# Patient Record
Sex: Male | Born: 1975 | Race: White | Hispanic: No | Smoking: Former smoker
Health system: Southern US, Community
[De-identification: ages and names within clinical notes are randomized; demographics above are authoritative.]

## PROBLEM LIST (undated history)

## (undated) DIAGNOSIS — K219 Gastro-esophageal reflux disease without esophagitis: Secondary | ICD-10-CM

## (undated) DIAGNOSIS — I871 Compression of vein: Secondary | ICD-10-CM

## (undated) DIAGNOSIS — K7460 Unspecified cirrhosis of liver: Secondary | ICD-10-CM

## (undated) DIAGNOSIS — G4733 Obstructive sleep apnea (adult) (pediatric): Secondary | ICD-10-CM

## (undated) HISTORY — DX: Obstructive sleep apnea (adult) (pediatric): G47.33

## (undated) HISTORY — PX: IMPACTED THIRD MOLAR REMOVAL: SHX1790

---

## 2010-02-26 ENCOUNTER — Ambulatory Visit: Payer: Self-pay | Admitting: Family Medicine

## 2010-02-26 DIAGNOSIS — K219 Gastro-esophageal reflux disease without esophagitis: Secondary | ICD-10-CM | POA: Insufficient documentation

## 2010-03-27 ENCOUNTER — Encounter: Payer: Self-pay | Admitting: Family Medicine

## 2010-03-27 ENCOUNTER — Ambulatory Visit: Payer: Self-pay | Admitting: Family Medicine

## 2010-03-27 LAB — CONVERTED CEMR LAB: H Pylori IgG: NEGATIVE

## 2010-03-31 DIAGNOSIS — E781 Pure hyperglyceridemia: Secondary | ICD-10-CM | POA: Insufficient documentation

## 2010-03-31 LAB — CONVERTED CEMR LAB
Cholesterol: 147 mg/dL (ref 0–200)
HDL: 30 mg/dL — ABNORMAL LOW (ref >39)
Platelets: 259 10*3/uL (ref 150–400)
RBC: 5.35 M/uL (ref 4.22–5.81)
Total CHOL/HDL Ratio: 4.9
Triglycerides: 318 mg/dL — ABNORMAL HIGH (ref <150)
VLDL: 64 mg/dL — ABNORMAL HIGH (ref 0–40)
WBC: 8.5 10*3/uL (ref 4.0–10.5)

## 2010-09-26 DIAGNOSIS — M545 Low back pain, unspecified: Secondary | ICD-10-CM | POA: Insufficient documentation

## 2010-09-30 ENCOUNTER — Encounter: Payer: Self-pay | Admitting: Family Medicine

## 2010-09-30 ENCOUNTER — Encounter
Admission: RE | Admit: 2010-09-30 | Discharge: 2010-09-30 | Payer: Self-pay | Source: Home / Self Care | Attending: Family Medicine | Admitting: Family Medicine

## 2010-10-01 ENCOUNTER — Ambulatory Visit: Payer: Self-pay | Admitting: Family Medicine

## 2010-11-20 NOTE — Letter (Signed)
Summary: Generic Letter  [facility]  [address]   [place], [place] [address]   Phone: [phone]  Fax: [phone]    09/30/2010  Loura Halt [address] [place], [place]  [address]  Dear Mr. ANDER,  Your back X rays were completely normal.  That is one more piece of evidence that your back pain is more of a periodic nusance rather than a serious medical problem.  I hope the medications and exercises we discussed help.  Call my office if you have questions.   Sincerely,      Doralee Albino MD

## 2010-11-20 NOTE — Assessment & Plan Note (Signed)
Summary: NP,DF   Vital Signs:  Patient profile:   35 year old male Height:      68.5 inches Weight:      212.7 pounds BMI:     31.99 Temp:     97.9 degrees F oral Pulse rate:   71 / minute BP sitting:   138 / 90  (left arm) Cuff size:   regular  Vitals Entered By: Gladstone Pih (Feb 26, 2010 8:39 AM) CC: NP  get established Is Patient Diabetic? No Pain Assessment Patient in pain? no        CC:  NP  get established.  History of Present Illness: Here to get established.   No active complaints but has some questions. Frequent heartburn late in the evening.   No prescription meds. Does have low back, ankle and knee pains after heavy exercise.  Takes ibuprofen - has not noticed a correlation with heartburn pain.  Habits & Providers  Alcohol-Tobacco-Diet     Alcohol drinks/day: <1     Alcohol Counseling: not indicated; patient does not drink     Tobacco Status: never     Diet Comments: Could be improved  Exercise-Depression-Behavior     Does Patient Exercise: yes     Exercise Counseling: not indicated; exercise is adequate     Exercise (avg: min/session): >60     Times/week: 4     Have you felt down or hopeless? no     Have you felt little pleasure in things? no     STD Risk: never     STD Risk Counseling: not indicated-no STD risk noted     Drug Use: never     Seat Belt Use: always     Sun Exposure: infrequent  Current Medications (verified): 1)  Vitamin C 500 Mg Tabs (Ascorbic Acid) .... 2 Per Day Otc 2)  B Complex-B12  Tabs (B Complex Vitamins) .... One Daily Otc 3)  Glucosamine-Chondroitin  Caps (Glucosamine-Chondroit-Vit C-Mn) .... One Two Times A Day Otc 4)  Epinephrine 0.3 Mg/0.62ml Devi (Epinephrine) .... Use Immediately If Stung By Yellowjacket 5)  Nexium 40 Mg Cpdr (Esomeprazole Magnesium) .... One By Mouth Daily  Allergies (verified): 1)  ! * Yellow Jackets 2)  ! * Watermelon  Past History:  Family History: Last updated: 02/26/2010 + HBP and  high chol and CVA and melanoma and DM and  EtOHism - depression  Social History: Last updated: 02/26/2010 [employer] work.  Risk Factors: Alcohol Use: <1 (02/26/2010) Diet: Could be improved (02/26/2010) Exercise: yes (02/26/2010)  Risk Factors: Smoking Status: never (02/26/2010)  Family History: + HBP and high chol and CVA and melanoma and DM and  EtOHism - depression  Social History: [employer] work.Smoking Status:  never Does Patient Exercise:  yes STD Risk:  never Drug Use:  never Seat Belt Use:  always Sun Exposure-Excessive:  infrequent  Physical Exam  General:  Well-developed,well-nourished,in no acute distress; alert,appropriate and cooperative throughout examination Lungs:  Normal respiratory effort, chest expands symmetrically. Lungs are clear to auscultation, no crackles or wheezes. Heart:  Normal rate and regular rhythm. S1 and S2 normal without gallop, murmur, click, rub or other extra sounds. Abdomen:  Bowel sounds positive,abdomen soft and non-tender without masses, organomegaly or hernias noted.   Impression & Recommendations:  Problem # 1:  GERD (ICD-530.81) most likely by history His updated medication list for this problem includes:    Nexium 40 Mg Cpdr (Esomeprazole magnesium) ..... One by mouth daily  Future Orders: CBC-[facility] ([address]) .Marland KitchenMarland Kitchen  02/18/2011 H pylori-[facility] ([id number]) ... 02/18/2011  Problem # 2:  SCREENING FOR LIPOID DISORDERS (ICD-V77.91)  Future Orders: Lipid-[facility] ([id number]) ... 02/18/2011  Problem # 3:  Preventive Health Care (ICD-V70.0) Generally healthy.  Tetanus today.  Major way to improve would be diet (low sodium because prehypertension)  Complete Medication List: 1)  Vitamin C 500 Mg Tabs (Ascorbic acid) .... 2 per day otc 2)  B Complex-b12 Tabs (B complex vitamins) .... One daily otc 3)  Glucosamine-chondroitin Caps (Glucosamine-chondroit-vit c-mn) .... One two times a day otc 4)  Epinephrine 0.3 Mg/0.50ml Devi (Epinephrine)  .... Use immediately if stung by yellowjacket 5)  Nexium 40 Mg Cpdr (Esomeprazole magnesium) .... One by mouth daily  Other Orders: Tdap => 36yrs IM ([address]) Admin 1st Vaccine ([address]) Prescriptions: NEXIUM 40 MG CPDR (ESOMEPRAZOLE MAGNESIUM) one by mouth daily  #90 x 3   Entered and Authorized by:   Doralee Albino MD   Signed by:   Doralee Albino MD on 02/26/2010   Method used:   Electronically to        [facility]* (retail)       [address]       [address] Shipping/mailing       [place], [place]  [address]       Ph: [phone]       Fax: [phone]   RxID:   [id number] EPINEPHRINE 0.3 MG/0.3ML DEVI (EPINEPHRINE) use immediately if stung by yellowjacket  #1 x 0   Entered and Authorized by:   Doralee Albino MD   Signed by:   Doralee Albino MD on 02/26/2010   Method used:   Electronically to        [facility]* (retail)       [address]       [address] [place] [address]       [place], [place]  [address]       Ph: [phone]       Fax: [phone]   RxID:   [id number]      Prevention & Chronic Care Immunizations   Influenza vaccine: Not documented    Tetanus booster: 02/26/2010: Tdap    Pneumococcal vaccine: Not documented  Other Screening   Smoking status: never  (02/26/2010)   Nursing Instructions: Give tetanus booster today     Immunizations Administered:  Tetanus Vaccine:    Vaccine Type: Tdap    Site: left deltoid    Mfr: GlaxoSmithKline    Dose: 0.5 ml    Route: IM    Given by: Gladstone Pih    Exp. Date: 01/11/2012    Lot #: [id number]    VIS given: 09/06/07 version given Feb 26, 2010.    Physician counseled: yes

## 2010-11-20 NOTE — Assessment & Plan Note (Signed)
Summary: back pain/eo   Vital Signs:  Patient profile:   35 year old male Weight:      215 pounds BMI:     32.33 Temp:     99.1 degrees F oral Pulse rate:   82 / minute Pulse rhythm:   regular BP sitting:   147 / 93  (left arm) Cuff size:   regular  Vitals Entered By: Loralee Pacas CMA (September 26, 2010 5:23 PM) CC: back pain Pain Assessment Patient in pain? yes     Location: lower back Intensity: 7   CC:  back pain.  History of Present Illness: Low back pain.  Recurrent episodes.  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  Initially had right leg pain.  Now only low back pain.  No bowel or bladder complaints.  No prior Xrays.  Current Medications (verified): 1)  Vitamin C 500 Mg Tabs (Ascorbic Acid) .... 2 Per Day Otc 2)  B Complex-B12  Tabs (B Complex Vitamins) .... One Daily Otc 3)  Glucosamine-Chondroitin  Caps (Glucosamine-Chondroit-Vit C-Mn) .... One Two Times A Day Otc 4)  Epinephrine 0.3 Mg/0.92ml Devi (Epinephrine) .... Use Immediately If Stung By Yellowjacket 5)  Nexium 40 Mg Cpdr (Esomeprazole Magnesium) .... One By Mouth Daily 6)  Ibuprofen 800 Mg Tabs (Ibuprofen) .... One By Mouth Three Times A Day As Needed Back/shoulder Pain 7)  Cyclobenzaprine Hcl 10 Mg Tabs (Cyclobenzaprine Hcl) .... One By Mouth At Bedtime As Needed Back Spasm  Allergies (verified): 1)  ! * Yellow Jackets 2)  ! * Watermelon  Past History:  Past medical, surgical, family and social histories (including risk factors) reviewed, and no changes noted (except as noted below).  Family History: Reviewed history from 02/26/2010 and no changes required. + HBP and high chol and CVA and melanoma and DM and  EtOHism Father diagnosed with cirrhosis and liver cancer 2011 - depression  Social History: Reviewed history from 02/26/2010 and no changes required. [employer] work.  Physical Exam  General:  Well-developed,well-nourished,in no acute distress; alert,appropriate and cooperative  throughout examination  Again, borderline HBP noted. Extremities:  Lumbar spasm with decreased ROM.  Normal strength and reflexes in legs   Impression & Recommendations:  Problem # 1:  LOW BACK PAIN (ICD-724.2) Educated.  Since no previous Xrays, will get lumbar films.  I'll call with results. His updated medication list for this problem includes:    Ibuprofen 800 Mg Tabs (Ibuprofen) ..... One by mouth three times a day as needed back/shoulder pain    Cyclobenzaprine Hcl 10 Mg Tabs (Cyclobenzaprine hcl) ..... One by mouth at bedtime as needed back spasm  Orders: Radiology other (Radiology Other)  Complete Medication List: 1)  Vitamin C 500 Mg Tabs (Ascorbic acid) .... 2 per day otc 2)  B Complex-b12 Tabs (B complex vitamins) .... One daily otc 3)  Glucosamine-chondroitin Caps (Glucosamine-chondroit-vit c-mn) .... One two times a day otc 4)  Epinephrine 0.3 Mg/0.3ml Devi (Epinephrine) .... Use immediately if stung by yellowjacket 5)  Nexium 40 Mg Cpdr (Esomeprazole magnesium) .... One by mouth daily 6)  Ibuprofen 800 Mg Tabs (Ibuprofen) .... One by mouth three times a day as needed back/shoulder pain 7)  Cyclobenzaprine Hcl 10 Mg Tabs (Cyclobenzaprine hcl) .... One by mouth at bedtime as needed back spasm Prescriptions: CYCLOBENZAPRINE HCL 10 MG TABS (CYCLOBENZAPRINE HCL) one by mouth at bedtime as needed back spasm  #30 x 3   Entered and Authorized by:   Doralee Albino MD   Signed  by:   Doralee Albino MD on 09/26/2010   Method used:   Electronically to        [facility]* (retail)       [address]       [address]mail[address]       [place], [place]  [address]       Ph: [phone]       Fax: [phone]   RxID:   [id number] IBUPROFEN 800 MG TABS (IBUPROFEN) one by mouth three times a day as needed back/shoulder pain  #100 x 3   Entered and Authorized by:   Doralee Albino MD   Signed by:   Doralee Albino MD on 09/26/2010   Method used:    Electronically to        [facility]* (retail)       [address]       [address] [place] [address]       [place], [place]  [address]       Ph: [phone]       Fax: [phone]   RxID:   [id number]    Orders Added: 1)  Radiology other [Radiology Other] 2)  [facility] [[address]]

## 2011-04-15 ENCOUNTER — Ambulatory Visit (INDEPENDENT_AMBULATORY_CARE_PROVIDER_SITE_OTHER): Payer: 59 | Admitting: Family Medicine

## 2011-04-15 ENCOUNTER — Encounter: Payer: Self-pay | Admitting: Family Medicine

## 2011-04-15 VITALS — BP 129/83 | HR 76 | Ht 68.0 in | Wt 213.2 lb

## 2011-04-15 DIAGNOSIS — R0683 Snoring: Secondary | ICD-10-CM

## 2011-04-15 DIAGNOSIS — R0609 Other forms of dyspnea: Secondary | ICD-10-CM

## 2011-04-15 DIAGNOSIS — L02419 Cutaneous abscess of limb, unspecified: Secondary | ICD-10-CM

## 2011-04-15 DIAGNOSIS — L03119 Cellulitis of unspecified part of limb: Secondary | ICD-10-CM | POA: Insufficient documentation

## 2011-04-15 DIAGNOSIS — R0989 Other specified symptoms and signs involving the circulatory and respiratory systems: Secondary | ICD-10-CM

## 2011-04-15 DIAGNOSIS — R5381 Other malaise: Secondary | ICD-10-CM

## 2011-04-15 DIAGNOSIS — R5383 Other fatigue: Secondary | ICD-10-CM | POA: Insufficient documentation

## 2011-04-15 DIAGNOSIS — Z0000 Encounter for general adult medical examination without abnormal findings: Secondary | ICD-10-CM

## 2011-04-15 DIAGNOSIS — G4700 Insomnia, unspecified: Secondary | ICD-10-CM

## 2011-04-15 MED ORDER — DOXYCYCLINE HYCLATE 100 MG PO TABS: 100.0000 mg | ORAL_TABLET | Freq: Two times a day (BID) | ORAL | Status: AC

## 2011-04-16 ENCOUNTER — Encounter: Payer: Self-pay | Admitting: Family Medicine

## 2011-04-16 DIAGNOSIS — G4733 Obstructive sleep apnea (adult) (pediatric): Secondary | ICD-10-CM | POA: Insufficient documentation

## 2011-04-16 DIAGNOSIS — Z0000 Encounter for general adult medical examination without abnormal findings: Secondary | ICD-10-CM | POA: Insufficient documentation

## 2011-04-16 DIAGNOSIS — G4700 Insomnia, unspecified: Secondary | ICD-10-CM | POA: Insufficient documentation

## 2011-04-16 LAB — COMPLETE METABOLIC PANEL WITH GFR
Albumin: 4.9 g/dL (ref 3.5–5.2)
Alkaline Phosphatase: 83 U/L (ref 39–117)
BUN: 10 mg/dL (ref 6–23)
Creat: 0.82 mg/dL (ref 0.50–1.35)
GFR, Est Non African American: >60 mL/min (ref >60)
Glucose, Bld: 88 mg/dL (ref 70–99)
Total Bilirubin: 0.5 mg/dL (ref 0.3–1.2)

## 2011-04-16 NOTE — Progress Notes (Signed)
  Subjective:    Patient ID: Jordan Collins, male    DOB: [birth year], 35 y.o.   MRN: [id number]  HPI  In for annual exam.  Three complaints: #1 sleep difficulty.  Discussed sleep hygiene.  Given handout. #2 snores loudly.  No apparent resp cessation.  Only snores on back. #3 red rash on legs with non healing ulcer for 1 month. Back pain improved and now doing exercises.    Not doing enough aerobic exercises - thinking about biking to work.     Review of Systems  Denies bleeding, CP, SOB, leg swelling.  No change in bowel, bladder or wt.  Endorses increased fatigue     Objective:   Physical Exam HEENT, nl Neck supple without mass Lungs clear Cardiac RRR Abd benign .Ext without edema  Small ulcers left lower leg with surrounding erythema.       Assessment & Plan:

## 2011-04-16 NOTE — Assessment & Plan Note (Signed)
Discussed and given handout on sleep hygiene

## 2011-04-16 NOTE — Assessment & Plan Note (Signed)
Check CMP - especially concerned re DM given non healing leg sores.

## 2011-04-16 NOTE — Assessment & Plan Note (Signed)
Healthy male.  Could do with modest wt loss and increased aerobic activity.

## 2011-04-16 NOTE — Assessment & Plan Note (Signed)
No symptoms of sleep apnea 

## 2011-04-16 NOTE — Assessment & Plan Note (Signed)
Doxy since present x 1 month.

## 2011-06-10 ENCOUNTER — Other Ambulatory Visit: Payer: Self-pay | Admitting: Family Medicine

## 2011-06-10 NOTE — Telephone Encounter (Signed)
Refill request

## 2011-08-05 ENCOUNTER — Ambulatory Visit (INDEPENDENT_AMBULATORY_CARE_PROVIDER_SITE_OTHER): Payer: 59 | Admitting: *Deleted

## 2011-08-05 ENCOUNTER — Ambulatory Visit: Payer: 59

## 2011-08-05 DIAGNOSIS — Z23 Encounter for immunization: Secondary | ICD-10-CM

## 2011-09-17 ENCOUNTER — Encounter: Payer: Self-pay | Admitting: *Deleted

## 2011-09-17 ENCOUNTER — Ambulatory Visit (INDEPENDENT_AMBULATORY_CARE_PROVIDER_SITE_OTHER): Payer: 59 | Admitting: Pulmonary Disease

## 2011-09-17 VITALS — BP 146/82 | HR 81 | Temp 98.5°F | Ht 67.0 in | Wt 215.0 lb

## 2011-09-17 DIAGNOSIS — Z720 Tobacco use: Secondary | ICD-10-CM

## 2011-09-17 DIAGNOSIS — Z87891 Personal history of nicotine dependence: Secondary | ICD-10-CM | POA: Insufficient documentation

## 2011-09-17 DIAGNOSIS — R0683 Snoring: Secondary | ICD-10-CM

## 2011-09-17 DIAGNOSIS — R1310 Dysphagia, unspecified: Secondary | ICD-10-CM | POA: Insufficient documentation

## 2011-09-17 DIAGNOSIS — R0989 Other specified symptoms and signs involving the circulatory and respiratory systems: Secondary | ICD-10-CM

## 2011-09-17 DIAGNOSIS — F17200 Nicotine dependence, unspecified, uncomplicated: Secondary | ICD-10-CM

## 2011-09-17 DIAGNOSIS — R0609 Other forms of dyspnea: Secondary | ICD-10-CM

## 2011-09-17 NOTE — Patient Instructions (Signed)
Will schedule home sleep test Will call to schedule follow up after home sleep test reviewed

## 2011-09-17 NOTE — Assessment & Plan Note (Signed)
He has snoring, witnessed apnea, and daytime sleepiness.  I am concerned that he could have sleep apnea.  I have explained how sleep apnea can affect the patient's health.  Driving precautions and importance of weight loss were discussed.  Treatment options for sleep apnea were reviewed.  To further assess will arrange for home sleep test. Explained that he may need additional in-lab tests, depending on results.

## 2011-09-17 NOTE — Assessment & Plan Note (Addendum)
He reports intermittent episodes of difficulty swallowing with solid foods (particularly meats) associated with need to regurgitate.    I have advised him to discuss with Dr. Leveda Anna, and then decide if he needs additional GI evaluation.  Of note is that he does have a history of reflux.

## 2011-09-17 NOTE — Assessment & Plan Note (Signed)
In addition to the myriad other adverse health consequences associated with smoking, I reviewed how cigarette smoking can affect his sleep.  Encouraged him to continue his smoking cessation efforts.

## 2011-09-17 NOTE — Progress Notes (Signed)
Chief Complaint  Patient presents with  . Advice Only    Pt wakes up catching his breath, snores at night sometimes very severe, sometimes  has apnea's while sleeping    History of Present Illness: Jordan Collins is a 35 y.o. male for evaluation of sleep problems.  He has noticed trouble with his sleep for some time.  His wife has told him he snores, and stops breathing while asleep.  He has noticed waking up with a choking sensation.  He is a restless sleeper.  He goes to bed at 11 pm.  He can take a while to fall asleep. He wakes up a couple times per night.  He gets out of bed at 730 am.  He feels tired throughout the day, especially in the late afternoon.  He denies headaches.  He is not using anything to help him sleep or stay awake.  The patient denies sleep walking, sleep talking, bruxism, or nightmares.  There is no history of restless legs.  He does get occasional cramps in his legs if he exerts himself too much during the day.  The patient denies sleep hallucinations, sleep paralysis, or cataplexy.  His Epworth score is 8 out of 24.  Past Medical History  Diagnosis Date  . Asthma     as a child    No past surgical history on file.  Current Outpatient Prescriptions on File Prior to Visit  Medication Sig Dispense Refill  . Ascorbic Acid (VITAMIN C) 500 MG tablet Take 1,000 mg by mouth daily as needed. OTC      . B Complex Vitamins (B COMPLEX-B12) TABS Take 1 tablet by mouth daily as needed.       Marland Kitchen EPINEPHrine (EPI-PEN) 0.3 MG/0.3ML DEVI Inject 0.3 mg into the muscle. immediately if stung by yellowjacket       . ibuprofen (ADVIL,MOTRIN) 800 MG tablet Take 800 mg by mouth 3 (three) times daily as needed. for back/shoulder pain       . NEXIUM 40 MG capsule TAKE 1 CAPSULE BY MOUTH ONCE DAILY  90 capsule  3    No Known Allergies  family history includes Heart disease in his paternal grandfather and Liver cancer in his father.   reports that he has been smoking Cigarettes.   He has a 5 pack-year smoking history. He has never used smokeless tobacco. He reports that he does not drink alcohol or use illicit drugs.  Review of Systems  HENT: Positive for trouble swallowing>>He reports trouble with solid foods (especially meats).   Respiratory: Positive for cough.   Musculoskeletal: Positive for arthralgias.    Blood pressure 146/82, pulse 81, temperature 98.5 F (36.9 C), temperature source Oral, height 5\' 7"  (1.702 m), weight 215 lb (97.523 kg), SpO2 97.00%. Body mass index is 33.67 kg/(m^2).  Physical Exam:  General - Obese HEENT - PERRLA, EOMI, no sinus tenderness, narrow nasal angles, no oral exudate, MP 3, no LAN, no thyromegaly Cardiac - s1s2 regular, no murmur Chest - no wheeze/rales/dullness Abdomen - soft, nontender Extremities - no e/c/c Neurologic - normal strength, CN intact Skin - no rashes Psychiatric - normal mood, behavior  Assessment/Plan:  Primary snoring He has snoring, witnessed apnea, and daytime sleepiness.  I am concerned that he could have sleep apnea.  I have explained how sleep apnea can affect the patient's health.  Driving precautions and importance of weight loss were discussed.  Treatment options for sleep apnea were reviewed.  To further assess will arrange for  home sleep test. Explained that he may need additional in-lab tests, depending on results.   Dysphagia He reports intermittent episodes of difficulty swallowing with solid foods (particularly meats) associated with need to regurgitate.    I have advised him to discuss with Dr. Leveda Anna, and then decide if he needs additional GI evaluation.  Of note is that he does have a history of reflux.  Tobacco abuse In addition to the myriad other adverse health consequences associated with smoking, I reviewed how cigarette smoking can affect his sleep.  Encouraged him to continue his smoking cessation efforts.     Outpatient Encounter Prescriptions as of 09/17/2011    Medication Sig Dispense Refill  . Ascorbic Acid (VITAMIN C) 500 MG tablet Take 1,000 mg by mouth daily as needed. OTC      . B Complex Vitamins (B COMPLEX-B12) TABS Take 1 tablet by mouth daily as needed.       Marland Kitchen EPINEPHrine (EPI-PEN) 0.3 MG/0.3ML DEVI Inject 0.3 mg into the muscle. immediately if stung by yellowjacket       . ibuprofen (ADVIL,MOTRIN) 800 MG tablet Take 800 mg by mouth 3 (three) times daily as needed. for back/shoulder pain       . NEXIUM 40 MG capsule TAKE 1 CAPSULE BY MOUTH ONCE DAILY  90 capsule  3  . Nutritional Supplements (MELATONIN PO) As needed       . DISCONTD: Glucosamine-Chondroit-Vit C-Mn (GLUCOSAMINE-CHONDROITIN) CAPS Take 1 capsule by mouth 2 (two) times daily.          Jordan Collins Pager:  [phone] 09/17/2011, 11:51 AM

## 2011-09-17 NOTE — Progress Notes (Deleted)
  Subjective:    Patient ID: Jordan Collins, male    DOB: 06/27/1976, 35 y.o.   MRN: [id number]  HPI    Review of Systems  HENT: Positive for trouble swallowing.   Respiratory: Positive for cough.   Musculoskeletal: Positive for arthralgias.  Heart Burn/Indigestion      Objective:   Physical Exam        Assessment & Plan:

## 2011-09-21 DIAGNOSIS — G4733 Obstructive sleep apnea (adult) (pediatric): Secondary | ICD-10-CM

## 2011-09-21 HISTORY — DX: Obstructive sleep apnea (adult) (pediatric): G47.33

## 2011-09-23 ENCOUNTER — Encounter: Payer: Self-pay | Admitting: Pulmonary Disease

## 2011-09-23 ENCOUNTER — Telehealth: Payer: Self-pay | Admitting: Pulmonary Disease

## 2011-09-23 ENCOUNTER — Ambulatory Visit (INDEPENDENT_AMBULATORY_CARE_PROVIDER_SITE_OTHER): Payer: 59 | Admitting: Pulmonary Disease

## 2011-09-23 DIAGNOSIS — R0683 Snoring: Secondary | ICD-10-CM

## 2011-09-23 DIAGNOSIS — G4733 Obstructive sleep apnea (adult) (pediatric): Secondary | ICD-10-CM

## 2011-09-23 NOTE — Telephone Encounter (Signed)
Home sleep study 09/21/11>>AHI 21.7, SpO2 low 84%.  Moderate to severe sleep apnea with positional effect.   I have reviewed his sleep test results with the patient.  Explained how sleep apnea can affect the patient's health.  Driving precautions and importance of weight loss were discussed.  Treatment options for sleep apnea were reviewed.  Will proceed with auto CPAP set up.  Will have my nurse schedule ROV in 6 to 8 weeks after set up.

## 2011-09-23 NOTE — Telephone Encounter (Signed)
I spoke with pt spouse Marliss Czar and she states she will call to make pt's apt once he is set up on cpap

## 2011-11-03 ENCOUNTER — Telehealth: Payer: Self-pay | Admitting: Pulmonary Disease

## 2011-11-03 ENCOUNTER — Encounter: Payer: Self-pay | Admitting: Pulmonary Disease

## 2011-11-03 NOTE — Telephone Encounter (Signed)
I spoke with Marliss Czar (pt spouse) and is aware of the results. She voiced her understanding will give pt results.

## 2011-11-03 NOTE — Telephone Encounter (Signed)
Auto CPAP 09/29/11 to 10/28/11>>Used on 26 of 30 nights with average 6 hrs.  Average AHI 2.5 with median pressure 8 cm H2O and 95th percentile pressure 12 cm H2O.  Will have my nurse inform pt that CPAP report looks good.  No change to current set up.

## 2011-11-05 ENCOUNTER — Other Ambulatory Visit: Payer: Self-pay | Admitting: Family Medicine

## 2011-11-05 NOTE — Telephone Encounter (Signed)
Refill request

## 2011-11-25 ENCOUNTER — Encounter: Payer: Self-pay | Admitting: Pulmonary Disease

## 2011-11-25 ENCOUNTER — Ambulatory Visit (INDEPENDENT_AMBULATORY_CARE_PROVIDER_SITE_OTHER): Payer: 59 | Admitting: Pulmonary Disease

## 2011-11-25 VITALS — BP 138/82 | HR 86 | Temp 98.6°F | Ht 67.0 in | Wt 210.8 lb

## 2011-11-25 DIAGNOSIS — G4733 Obstructive sleep apnea (adult) (pediatric): Secondary | ICD-10-CM

## 2011-11-25 NOTE — Progress Notes (Signed)
Chief Complaint  Patient presents with  . Follow-up    Pt states he wears his cpap everynight x 4-8 hrs a night. Pt denies any problems w/ mask or machine. Pt states he sleep better w/ the machine and feels more rested during the day    History of Present Illness: GIOVANY COSBY is a 36 y.o. male OSA.  Home sleep study 09/21/11>>AHI 21.7, SpO2 low 84%. Moderate to severe sleep apnea with positional effect.  Auto CPAP 09/29/11 to 10/28/11>>Used on 26 of 30 nights with average 6 hrs. Average AHI 2.5 with median pressure 8 cm H2O and 95th percentile pressure 12 cm H2O.  He has a full face mask.  He is sleeping better, and feels more energy during the day.  He is not having any trouble with his mask.  He has stopped smoking.  He is enrolled in a weight loss program.  Past Medical History  Diagnosis Date  . Asthma     as a child  . OSA (obstructive sleep apnea) 09/21/2011    No past surgical history on file.  No Known Allergies  Physical Exam:  Blood pressure 138/82, pulse 86, temperature 98.6 F (37 C), temperature source Oral, height 5\' 7"  (1.702 m), weight 210 lb 12.8 oz (95.618 kg), SpO2 99.00%. Body mass index is 33.02 kg/(m^2). Wt Readings from Last 2 Encounters:  11/25/11 210 lb 12.8 oz (95.618 kg)  09/17/11 215 lb (97.523 kg)   General - Obese  HEENT - PERRLA, EOMI, no sinus tenderness, narrow nasal angles, no oral exudate, MP 3, no LAN, no thyromegaly  Cardiac - s1s2 regular, no murmur  Chest - no wheeze/rales/dullness  Abdomen - soft, nontender  Extremities - no e/c/c  Neurologic - normal strength, CN intact  Skin - no rashes  Psychiatric - normal mood, behavior  Assessment/Plan:  Outpatient Encounter Prescriptions as of 11/25/2011  Medication Sig Dispense Refill  . Ascorbic Acid (VITAMIN C) 500 MG tablet Take 1,000 mg by mouth daily as needed. OTC      . B Complex Vitamins (B COMPLEX-B12) TABS Take 1 tablet by mouth daily as needed.       Marland Kitchen EPINEPHrine (EPI-PEN)  0.3 MG/0.3ML DEVI Inject 0.3 mg into the muscle. immediately if stung by yellowjacket       . ibuprofen (ADVIL,MOTRIN) 800 MG tablet TAKE 1 TABLET BY MOUTH 3 TIMES DAILY AS NEEDED FOR BACK/SHOULDER PAIN  100 tablet  2  . NEXIUM 40 MG capsule TAKE 1 CAPSULE BY MOUTH ONCE DAILY  90 capsule  3  . Nutritional Supplements (MELATONIN PO) As needed         Marisha Renier Pager:  [phone] 11/25/2011, 5:17 PM

## 2011-11-25 NOTE — Patient Instructions (Signed)
Follow up in 6 months 

## 2011-11-25 NOTE — Assessment & Plan Note (Signed)
He is doing well.  He is compliant and reports benefit.  Will continue auto CPAP.

## 2012-01-12 ENCOUNTER — Emergency Department (HOSPITAL_COMMUNITY)
Admission: EM | Admit: 2012-01-12 | Discharge: 2012-01-12 | Disposition: A | Discharge status: Home / Self Care | Payer: 59 | Attending: Emergency Medicine | Admitting: Emergency Medicine

## 2012-01-12 ENCOUNTER — Other Ambulatory Visit: Payer: Self-pay

## 2012-01-12 ENCOUNTER — Emergency Department (HOSPITAL_COMMUNITY): Payer: 59

## 2012-01-12 ENCOUNTER — Encounter (HOSPITAL_COMMUNITY): Payer: Self-pay | Admitting: *Deleted

## 2012-01-12 DIAGNOSIS — R079 Chest pain, unspecified: Secondary | ICD-10-CM | POA: Insufficient documentation

## 2012-01-12 DIAGNOSIS — R0602 Shortness of breath: Secondary | ICD-10-CM | POA: Insufficient documentation

## 2012-01-12 HISTORY — DX: Gastro-esophageal reflux disease without esophagitis: K21.9

## 2012-01-12 LAB — CBC
HCT: 42.9 % (ref 39.0–52.0)
Hemoglobin: 15.2 g/dL (ref 13.0–17.0)
MCH: 29.4 pg (ref 26.0–34.0)
MCHC: 35.4 g/dL (ref 30.0–36.0)
MCV: 83 fL (ref 78.0–100.0)
Platelets: 265 10*3/uL (ref 150–400)
RBC: 5.17 MIL/uL (ref 4.22–5.81)
RDW: 12.5 % (ref 11.5–15.5)
WBC: 9.1 10*3/uL (ref 4.0–10.5)

## 2012-01-12 LAB — DIFFERENTIAL
Basophils Absolute: 0 10*3/uL (ref 0.0–0.1)
Basophils Relative: 0 % (ref 0–1)
Eosinophils Absolute: 0.1 10*3/uL (ref 0.0–0.7)
Eosinophils Relative: 1 % (ref 0–5)
Lymphocytes Relative: 25 % (ref 12–46)
Lymphs Abs: 2.2 10*3/uL (ref 0.7–4.0)
Monocytes Absolute: 0.7 10*3/uL (ref 0.1–1.0)
Monocytes Relative: 8 % (ref 3–12)
Neutro Abs: 6 10*3/uL (ref 1.7–7.7)
Neutrophils Relative %: 67 % (ref 43–77)

## 2012-01-12 LAB — POCT I-STAT TROPONIN I: Troponin i, poc: 0 ng/mL (ref 0.00–0.08)

## 2012-01-12 LAB — BASIC METABOLIC PANEL
BUN: 8 mg/dL (ref 6–23)
CO2: 28 mEq/L (ref 19–32)
Calcium: 9.4 mg/dL (ref 8.4–10.5)
Chloride: 99 mEq/L (ref 96–112)
Creatinine, Ser: 0.89 mg/dL (ref 0.50–1.35)
GFR calc Af Amer: >90 mL/min (ref >90)
GFR calc non Af Amer: >90 mL/min (ref >90)
Glucose, Bld: 94 mg/dL (ref 70–99)
Potassium: 3.9 mEq/L (ref 3.5–5.1)
Sodium: 137 mEq/L (ref 135–145)

## 2012-01-12 LAB — TROPONIN I: Troponin I: <0.3 ng/mL (ref <0.30)

## 2012-01-12 MED ORDER — ASPIRIN 81 MG PO CHEW
CHEWABLE_TABLET | ORAL | Status: AC
  Administered 2012-01-12: 324 mg
  Filled 2012-01-12: qty 4

## 2012-01-12 NOTE — Discharge Instructions (Signed)
Chest Pain, Nonspecific  It is often hard to give a specific diagnosis for the cause of chest pain. There is always a chance that your pain could be related to something serious, like a heart attack or a blood clot in the lungs. You need to follow up with your caregiver for further evaluation. More lab tests or other studies such as X-rays, electrocardiography, stress testing, or cardiac imaging may be needed to find the cause of your pain.  Most of the time, nonspecific chest pain improves within 2 to 3 days with rest and mild pain medicine. For the next few days, avoid physical exertion or activities that bring on pain. Do not smoke. Avoid drinking alcohol. Call your caregiver for routine follow-up as advised.   SEEK IMMEDIATE MEDICAL CARE IF:   You develop increased chest pain or pain that radiates to the arm, neck, jaw, back, or abdomen.   You develop shortness of breath, increased coughing, or you start coughing up blood.   You have severe back or abdominal pain, nausea, or vomiting.   You develop severe weakness, fainting, fever, or chills.  Document Released: 10/05/2005 Document Revised: 09/24/2011 Document Reviewed: 03/25/2007  [employer] Patient Information 2012 [employer].

## 2012-01-12 NOTE — ED Notes (Signed)
Pt sts was at work and bent over, had sudden onset tight L chest pain. Severe pain lasted 30-68min, resolved somewhat after sitting down and drinking water. Took 200mg  ibuprofen at that time. At time of pain, had cold sweats, nausea, sob, dizziness and was told his face was very flushed. Pain remains but not as severe, no associated symptoms at this time either.

## 2012-01-12 NOTE — ED Provider Notes (Addendum)
History    36 year old male with chest pain. Acute onset while bending over while at work. Left anterior chest without radiation. The pain was sharp and lasted about a half hour. currently no complaints. Associated with some mild dyspnea and dizziness. History of similar pain a couple years ago which he states he was evaluated for without clear etiology. Patient was in his usual state of health prior to this. Patient has a history of gastroesophageal reflux but states that his current symptoms are different. No fevers or chills. No cough. No unusual leg pain or swelling. CSN: [id number]  Arrival date & time 01/12/12  1746   First MD Initiated Contact with Patient 01/12/12 1957      Chief Complaint  Patient presents with  . Chest Pain    (Consider location/radiation/quality/duration/timing/severity/associated sxs/prior treatment) HPI  Past Medical History  Diagnosis Date  . Asthma     as a child  . OSA (obstructive sleep apnea) 09/21/2011  . GERD (gastroesophageal reflux disease)     History reviewed. No pertinent past surgical history.  Family History  Problem Relation Age of Onset  . Heart disease Paternal Grandfather   . Liver cancer Father     History  Substance Use Topics  . Smoking status: Former Smoker -- 0.5 packs/day for 10 years    Types: Cigarettes    Quit date: 09/19/2011  . Smokeless tobacco: Never Used  . Alcohol Use: No      Review of Systems   Review of symptoms negative unless otherwise noted in HPI.   Allergies  Review of patient's allergies indicates no known allergies.  Home Medications   Current Outpatient Rx  Name Route Sig Dispense Refill  . VITAMIN C 500 MG PO TABS Oral Take 1,000 mg by mouth daily.     . B COMPLEX-B12 PO TABS Oral Take 1 tablet by mouth daily.     Marland Kitchen EPINEPHRINE 0.3 MG/0.3ML IJ DEVI Intramuscular Inject 0.3 mg into the muscle as needed. immediately if stung by yellowjacket    . IBUPROFEN 800 MG PO TABS  TAKE 1 TABLET  BY MOUTH 3 TIMES DAILY AS NEEDED FOR BACK/SHOULDER PAIN 100 tablet 2  . NEXIUM 40 MG PO CPDR  TAKE 1 CAPSULE BY MOUTH ONCE DAILY 90 capsule 3    BP 144/77  Pulse 76  Temp(Src) 98.2 F (36.8 C) (Oral)  Resp 18  SpO2 99%  Physical Exam  Nursing note and vitals reviewed. Constitutional: He appears well-developed and well-nourished. No distress.  HENT:  Head: Normocephalic and atraumatic.  Eyes: Conjunctivae are normal. Pupils are equal, round, and reactive to light. Right eye exhibits no discharge. Left eye exhibits no discharge.  Neck: Normal range of motion. Neck supple.  Cardiovascular: Normal rate, regular rhythm and normal heart sounds.  Exam reveals no gallop and no friction rub.   No murmur heard. Pulmonary/Chest: Effort normal and breath sounds normal. No respiratory distress. He exhibits no tenderness.  Abdominal: Soft. He exhibits no distension. There is no tenderness.  Musculoskeletal: He exhibits no edema and no tenderness.       Extremities are symmetric as compared to each other. There is no calf tenderness. No edema appreciated  Neurological: He is alert.  Skin: Skin is warm and dry. He is not diaphoretic.  Psychiatric: His behavior is normal. Thought content normal.    ED Course  Procedures (including critical care time)   Labs Reviewed  CBC  DIFFERENTIAL  BASIC METABOLIC PANEL  POCT I-STAT TROPONIN I  TROPONIN I  LAB REPORT - SCANNED   Dg Chest 2 View  01/12/2012  *RADIOLOGY REPORT*  Clinical Data: Chest pain and shortness of breath.  CHEST - 2 VIEW  Comparison:  None.  Findings:  The heart size and mediastinal contours are within normal limits.  Both lungs are clear.  The visualized skeletal structures are unremarkable.  IMPRESSION: No active cardiopulmonary disease.  Original Report Authenticated By: Danae Orleans, M.D.   EKG:  Rhythm: Normal sinus rhythm Rate: 75 Axis: Normal axis Intervals: Normal ST segments: Nonspecific ST changes   1. Chest  pain       MDM  35yM with chest pain. Consider ACS but doubt. EKG is non-provocative. Troponin x2 is negative. Chest x-ray does not show any acute abnormalities. Labs are unremarkable. Possibly musculoskeletal with onset with bending motion. Doubt infectious. Doubt pulmonary embolism. Doubt GERD. Return precautions were discussed. Outpatient followup.        Raeford Razor, MD 01/12/12 1610  Raeford Razor, MD 01/13/12 1416

## 2012-01-22 ENCOUNTER — Ambulatory Visit (INDEPENDENT_AMBULATORY_CARE_PROVIDER_SITE_OTHER): Payer: 59 | Admitting: Family Medicine

## 2012-01-22 ENCOUNTER — Encounter: Payer: Self-pay | Admitting: Family Medicine

## 2012-01-22 DIAGNOSIS — M545 Low back pain, unspecified: Secondary | ICD-10-CM

## 2012-01-22 DIAGNOSIS — R079 Chest pain, unspecified: Secondary | ICD-10-CM

## 2012-01-22 DIAGNOSIS — E781 Pure hyperglyceridemia: Secondary | ICD-10-CM

## 2012-01-22 LAB — LIPID PANEL
Cholesterol: 160 mg/dL (ref 0–200)
Triglycerides: 178 mg/dL — ABNORMAL HIGH (ref <150)
VLDL: 36 mg/dL (ref 0–40)

## 2012-01-22 MED ORDER — CYCLOBENZAPRINE HCL 10 MG PO TABS: 10.0000 mg | ORAL_TABLET | Freq: Three times a day (TID) | ORAL | Status: DC | PRN

## 2012-01-22 NOTE — Assessment & Plan Note (Signed)
Chest pain with many typical and a few atypical components. Framingham 10 year risk score is less than 1% for MI.  Still, given typical nature, will proceed with ETT.  If neg, I would not work up further.  Will also repeat lipids.  He is fasting today.

## 2012-01-22 NOTE — Progress Notes (Signed)
  Subjective:    Patient ID: Jordan Collins, male    DOB: July 28, 1976, 36 y.o.   MRN: [id number]  HPI 10 days ago had the abrupt onset of severe Lt sided ant chest pain, squeezing pressure.  Radiated down Lt arm and Lt leg.  Accompanied by lightheadedness, dry mouth and nausea. No shortness of breath but did have pain with deep breath.  Also had diaphoresis.   No cough fever Did not feel like his GERD symptoms.  Pain was worse with movement  Risk factors for CAD: + FHx    Quit smoking 1 year ago    Great LDL, low HDL, high triglycerides    No DM, no HBP  No recent surg, immobility, previous DVT or PE, no trauma.  Seen in ER, normal CXR, EKG and cardiac enzymes.    Review of Systems     Objective:   Physical Exam Lungs clear Cardiac RRR without m or g Abd benign       Assessment & Plan:

## 2012-01-22 NOTE — Patient Instructions (Signed)
Since you are fasting today, I will recheck your cholesterol I doubt you have heart problems but I will order a stress test since you have a pretty good story.

## 2012-01-25 ENCOUNTER — Encounter: Payer: Self-pay | Admitting: Family Medicine

## 2012-02-01 ENCOUNTER — Telehealth: Payer: Self-pay | Admitting: Family Medicine

## 2012-02-01 NOTE — Telephone Encounter (Signed)
Is asking about when he stress test is

## 2012-02-01 NOTE — Telephone Encounter (Signed)
LVM to inform patient that this order was sent to sports medicine(this is where they are preformed) and they will contact him about appointment

## 2012-02-02 NOTE — Telephone Encounter (Signed)
Spoke with pt and gave previous phone note information. Pt understood and agreed.Jordan Collins

## 2012-02-02 NOTE — Telephone Encounter (Signed)
LVM for patient to call back to inform him of his exercise tolerance test Dr. Leveda Anna has ordered for him. Test is 03/04/2012 patient to arrive at 10:30am. I am mailing out the patient some information about this and how to prepare.

## 2012-02-10 ENCOUNTER — Telehealth: Payer: Self-pay | Admitting: Family Medicine

## 2012-02-10 MED ORDER — EPINEPHRINE 0.3 MG/0.3ML IJ DEVI: 0.3000 mg | INTRAMUSCULAR | Status: DC | PRN

## 2012-02-10 NOTE — Telephone Encounter (Signed)
Pt states that his epi pen has expired and would like to get a twin pack [employer]

## 2012-02-10 NOTE — Telephone Encounter (Signed)
He wants an Epi Pen for work and for home.  Sent in RX for two Epi Pens.

## 2012-03-04 ENCOUNTER — Telehealth: Payer: Self-pay | Admitting: Family Medicine

## 2012-03-04 ENCOUNTER — Ambulatory Visit (HOSPITAL_COMMUNITY)
Admission: RE | Admit: 2012-03-04 | Discharge: 2012-03-04 | Disposition: A | Discharge status: Home / Self Care | Payer: 59 | Source: Ambulatory Visit | Attending: Family Medicine | Admitting: Family Medicine

## 2012-03-04 ENCOUNTER — Encounter: Payer: 59 | Admitting: Family Medicine

## 2012-03-04 DIAGNOSIS — R079 Chest pain, unspecified: Secondary | ICD-10-CM | POA: Insufficient documentation

## 2012-03-04 NOTE — Telephone Encounter (Signed)
Needs authorizaton for admission.  She isn't sure if authorization needed and the patient is waiting.

## 2012-03-04 NOTE — Telephone Encounter (Signed)
Spoke with [employer].  Advised I normally don't do prior authorizations for stress test.  Also patient has [employer] and I don't think they require Prior-Authorization.  [name] is going to call the insurance company to see if it is needed and will call me back if a Prior Authorization is needed.  Jordan Collins

## 2012-04-11 ENCOUNTER — Other Ambulatory Visit: Payer: Self-pay | Admitting: Family Medicine

## 2012-06-02 ENCOUNTER — Encounter: Payer: Self-pay | Admitting: Pulmonary Disease

## 2012-06-02 ENCOUNTER — Ambulatory Visit (INDEPENDENT_AMBULATORY_CARE_PROVIDER_SITE_OTHER): Payer: 59 | Admitting: Pulmonary Disease

## 2012-06-02 VITALS — BP 138/78 | HR 72 | Temp 98.2°F | Ht 68.0 in | Wt 222.8 lb

## 2012-06-02 DIAGNOSIS — G4733 Obstructive sleep apnea (adult) (pediatric): Secondary | ICD-10-CM

## 2012-06-02 NOTE — Progress Notes (Signed)
Chief Complaint  Patient presents with  . Follow-up    pt states he wears his cpap machine everynight x 4-6 hrs a night. pt c/o snoring at night and is not able to go to sleep at night.    History of Present Illness: Jordan Collins is a 36 y.o. male OSA.  His wife says he is snoring again.  He is feeling sleepy in the afternoon again.  He is not smoking.  He started having a drink at 9 pm to help him unwind.  Past Medical History  Diagnosis Date  . Asthma     as a child  . OSA (obstructive sleep apnea) 09/21/2011  . GERD (gastroesophageal reflux disease)     No past surgical history on file.  No Known Allergies  Physical Exam:  Blood pressure 138/78, pulse 72, temperature 98.2 F (36.8 C), temperature source Oral, height 5\' 8"  (1.727 m), weight 222 lb 12.8 oz (101.061 kg), SpO2 97.00%.  Body mass index is 33.88 kg/(m^2). Wt Readings from Last 2 Encounters:  06/02/12 222 lb 12.8 oz (101.061 kg)  01/22/12 215 lb 14.4 oz (97.932 kg)   General - Obese  HEENT - PERRLA, EOMI, no sinus tenderness, narrow nasal angles, no oral exudate, MP 3, no LAN, no thyromegaly  Cardiac - s1s2 regular, no murmur  Chest - no wheeze/rales/dullness  Abdomen - soft, nontender  Extremities - no e/c/c  Neurologic - normal strength, CN intact  Skin - no rashes  Psychiatric - normal mood, behavior  Assessment/Plan:  Outpatient Encounter Prescriptions as of 06/02/2012  Medication Sig Dispense Refill  . aspirin 81 MG tablet Take 81 mg by mouth daily.      . cyclobenzaprine (FLEXERIL) 10 MG tablet Take 1 tablet (10 mg total) by mouth 3 (three) times daily as needed.  30 tablet  3  . EPINEPHrine (EPI-PEN) 0.3 mg/0.3 mL DEVI Inject 0.3 mLs (0.3 mg total) into the muscle as needed. immediately if stung by yellowjacket  2 Device  0  . ibuprofen (ADVIL,MOTRIN) 800 MG tablet TAKE 1 TABLET BY MOUTH 3 TIMES DAILY AS NEEDED FOR BACK/SHOULDER PAIN  100 tablet  2  . NEXIUM 40 MG capsule TAKE 1 CAPSULE BY  MOUTH ONCE DAILY  90 capsule  3  . Nutritional Supplements (EQUATE PO) Take 1 tablet by mouth daily.      Marland Kitchen DISCONTD: Ascorbic Acid (VITAMIN C) 500 MG tablet Take 1,000 mg by mouth daily.       Marland Kitchen DISCONTD: B Complex Vitamins (B COMPLEX-B12) TABS Take 1 tablet by mouth daily.         Kamilla Hands Pager:  [phone] 06/02/2012, 4:32 PM

## 2012-06-02 NOTE — Assessment & Plan Note (Signed)
He has more daytime fatigue.  His wife reports he is snoring more.  I have advised him to avoid drinking alcohol too close to bedtime.  Will get his CPAP download, and then determine if he needs to have adjustment to his set up.

## 2012-06-02 NOTE — Patient Instructions (Signed)
Will get CPAP report and call with results Follow up in 6 months 

## 2012-06-06 ENCOUNTER — Telehealth: Payer: Self-pay | Admitting: Pulmonary Disease

## 2012-06-06 DIAGNOSIS — G4733 Obstructive sleep apnea (adult) (pediatric): Secondary | ICD-10-CM

## 2012-06-06 NOTE — Telephone Encounter (Signed)
Auto CPAP 09/28/11 to 06/02/12>>Used on 193 of 249 nights with average 4 hrs 37 min.  Average AHI 2.6 with median CPAP 8 cm H2O and 95th percentile CPAP 14 cm H2O.  Will have my nurse inform pt that CPAP report shows good control of sleep apnea.  However, he needs to use machine for entire time he is asleep to get maximal benefit.  No change to current set up otherwise.

## 2012-06-06 NOTE — Telephone Encounter (Signed)
I spoke with pt spouse Antolin Belsito and is aware of results. She will inform pt

## 2012-08-12 ENCOUNTER — Ambulatory Visit (INDEPENDENT_AMBULATORY_CARE_PROVIDER_SITE_OTHER): Payer: 59 | Admitting: Family Medicine

## 2012-08-12 VITALS — BP 144/79 | HR 73 | Temp 98.4°F | Ht 67.0 in | Wt 219.9 lb

## 2012-08-12 DIAGNOSIS — Z23 Encounter for immunization: Secondary | ICD-10-CM

## 2012-08-12 DIAGNOSIS — R1032 Left lower quadrant pain: Secondary | ICD-10-CM

## 2012-08-12 LAB — CBC WITH DIFFERENTIAL/PLATELET
Basophils Absolute: 0 10*3/uL (ref 0.0–0.1)
Eosinophils Relative: 1 % (ref 0–5)
Lymphocytes Relative: 26 % (ref 12–46)
Lymphs Abs: 2.2 10*3/uL (ref 0.7–4.0)
MCV: 83.5 fL (ref 78.0–100.0)
Neutro Abs: 5.6 10*3/uL (ref 1.7–7.7)
Neutrophils Relative %: 65 % (ref 43–77)
Platelets: 261 10*3/uL (ref 150–400)
RBC: 5.59 MIL/uL (ref 4.22–5.81)
RDW: 12.8 % (ref 11.5–15.5)
WBC: 8.5 10*3/uL (ref 4.0–10.5)

## 2012-08-12 LAB — POCT SEDIMENTATION RATE: POCT SED RATE: 11 mm/hr (ref 0–22)

## 2012-08-12 NOTE — Patient Instructions (Addendum)
Thank you for coming in today, it was good to see you I am going to check some labwork today Hopefully I will get this back by this afternoon I will call you and keep you updated.

## 2012-08-14 NOTE — Assessment & Plan Note (Signed)
Differential includes diverticulitis, inflammatory bowel disease, irritable bowel syndrome and appendicitis.  Appendicitis I feel is less likely given relatively benign abdominal exam, lack of fever, and no nausea/vomiting.  Will check cbc and ESR to evaluated for Diverticulitis vs IBD.  If elevated wbc, may warrant CT of abdomen.  If this continues may need colonoscopy.

## 2012-08-14 NOTE — Progress Notes (Signed)
  Subjective:    Patient ID: Jordan Collins, male    DOB: May 07, 1976, 36 y.o.   MRN: [id number]  HPI  1. Abdominal pain:  Here with c/o of abdominal pain x7 days.  Pain is located in LLQ of abdomen. Pain is intermittent and worse with eating.  This morning pain woke him from sleep and had to have BM during the middle of the night.  This has never occurred before.  Has had "stomach issues" off and on for the past 8-10 months but has gotten worse recently.  Does endorse diarrhea without nausea or vomiting.  Has seen what looks like blood in his stool in the past but nothing recently.  He feels ok if he does not eat.  Denies any change in diet, fever, chills,  recent travel, family history or early colon cancer, crohns or ulcerative colitis.   Review of Systems Per HPI    Objective:   Physical Exam  Constitutional: He appears well-nourished. No distress.  HENT:  Head: Normocephalic and atraumatic.  Cardiovascular: Normal rate and regular rhythm.   Pulmonary/Chest: Effort normal and breath sounds normal.  Abdominal: Soft. Bowel sounds are normal. He exhibits no distension. There is tenderness (LLQ, without rebound). There is no rebound and no guarding.  Neurological: He is alert.  Skin: Skin is warm.          Assessment & Plan:

## 2012-09-28 ENCOUNTER — Other Ambulatory Visit: Payer: Self-pay | Admitting: Family Medicine

## 2012-11-24 ENCOUNTER — Ambulatory Visit (INDEPENDENT_AMBULATORY_CARE_PROVIDER_SITE_OTHER): Payer: 59 | Admitting: Family Medicine

## 2012-11-24 ENCOUNTER — Encounter: Payer: Self-pay | Admitting: Family Medicine

## 2012-11-24 VITALS — BP 152/87 | HR 76 | Temp 98.4°F | Ht 67.0 in | Wt 232.0 lb

## 2012-11-24 DIAGNOSIS — M545 Low back pain, unspecified: Secondary | ICD-10-CM

## 2012-11-24 DIAGNOSIS — M79609 Pain in unspecified limb: Secondary | ICD-10-CM

## 2012-11-24 DIAGNOSIS — J069 Acute upper respiratory infection, unspecified: Secondary | ICD-10-CM

## 2012-11-24 DIAGNOSIS — M62838 Other muscle spasm: Secondary | ICD-10-CM

## 2012-11-24 DIAGNOSIS — M7542 Impingement syndrome of left shoulder: Secondary | ICD-10-CM | POA: Insufficient documentation

## 2012-11-24 DIAGNOSIS — M79673 Pain in unspecified foot: Secondary | ICD-10-CM | POA: Insufficient documentation

## 2012-11-24 MED ORDER — CYCLOBENZAPRINE HCL 10 MG PO TABS: 10.0000 mg | ORAL_TABLET | Freq: Three times a day (TID) | ORAL | Status: DC | PRN

## 2012-11-24 MED ORDER — IBUPROFEN 800 MG PO TABS: 800.0000 mg | ORAL_TABLET | Freq: Three times a day (TID) | ORAL | Status: DC | PRN

## 2012-11-24 MED ORDER — MOMETASONE FUROATE 50 MCG/ACT NA SUSP: 2.0000 | Freq: Every day | NASAL | Status: DC

## 2012-11-24 NOTE — Assessment & Plan Note (Signed)
Discussed natural course of muscle strain/spasm.  rx for flexeril and ibuprofen.  Advised heat and massage therapy.

## 2012-11-24 NOTE — Assessment & Plan Note (Signed)
Discussed that antibiotics do not help this go away faster.  Rx for nasonex to help with nasal congestion.  Discussed supportive care.

## 2012-11-24 NOTE — Progress Notes (Signed)
  Subjective:    Patient ID: Jordan Collins, male    DOB: 03/09/1976, 37 y.o.   MRN: [id number]  HPI:  [name] comes in with several complaints:   He has had nasal congestion and cough for about 4 days.  He has taken dayquill and Nyquil which helps some. No fevers, chills, headaches.   Shoulder pain- Monday at work noticed pain left in upper back/shoulder area.  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  He has had no injury or falls.  Pain was sharp and severe, but he says the pain has improved some with ibuprofen, now is more of a dull throb.  No neck pain, numbness, tingling, weakness in hand.   Heel pain - left heel, has been going on several months, has tried Dr. Rodolph Bong. Hurts first step out of bed, hurts after long shift at work.  No injury.  He wears hard soled cloges at work most of the time.   Past Medical History  Diagnosis Date  . Asthma     as a child  . OSA (obstructive sleep apnea) 09/21/2011  . GERD (gastroesophageal reflux disease)     History  Substance Use Topics  . Smoking status: Former Smoker -- 0.5 packs/day for 10 years    Types: Cigarettes    Quit date: 09/19/2011  . Smokeless tobacco: Never Used  . Alcohol Use: No    Family History  Problem Relation Age of Onset  . Heart disease Paternal Grandfather   . Liver cancer Father      ROS Pertinent items in HPI    Objective:  Physical Exam:  BP 152/87  Pulse 76  Temp 98.4 F (36.9 C) (Oral)  Ht 5\' 7"  (1.702 m)  Wt 232 lb (105.235 kg)  BMI 36.34 kg/m2 General appearance: alert, cooperative and no distress HEENT: Nares with some congestion, Ears TM's normal bilaterally, No facial pain or adenopathy Lungs: clear to auscultation bilaterally Heart: regular rate and rhythm, S1, S2 normal, no murmur, click, rub or gallop Pulses: 2+ and symmetric  Left Shoulder: Inspection reveals no abnormalities, atrophy or asymmetry. Palpation is normal with no tenderness over AC joint or bicipital groove. ROM is full  in all planes. Rotator cuff strength normal throughout. No signs of impingement with negative Neer and Hawkin's tests, empty can. Patient has no spinal TTP, but has muscle spasm and tenderness to palpation in left trapezius.   Left Heel: No abnormalities or deformities on inspection of the foot.  Normal strength, sensation and ROM.  Patient has TTP at insertion of plantar fascia in heel.      Assessment & Plan:

## 2012-11-24 NOTE — Assessment & Plan Note (Addendum)
Suspect plantar fascitis.  Discussed diagnosis, gave hand out with information and home exercise program from sports medicine patient advisor. Rx for ibuprofen 800, schedule x 1 week, and advised to ice BID.  Also gave heel cups to wear in shoes.  Advised if not improving can see Sports Medicine.

## 2012-11-24 NOTE — Patient Instructions (Signed)
You have a cold which is caused by a virus, antibiotics will not help it get better faster.  You can try the nasonex to help decongest your nose.    You have muscle spasm in your shoulder, please try the flexeril, ibuprofen, and use icy hot and a heating pad on the shoulder.  Also, be sure to stretch your shoulder several times a day.   I think your heel pain is from plantar fascitis.  Please see the attached hand out with instructions and stretches.  Also, you can try the heel cups.

## 2013-01-16 ENCOUNTER — Encounter: Payer: Self-pay | Admitting: Family Medicine

## 2013-01-16 MED ORDER — PANTOPRAZOLE SODIUM 40 MG PO TBEC: 40.0000 mg | DELAYED_RELEASE_TABLET | Freq: Every day | ORAL | Status: DC

## 2013-01-16 NOTE — Progress Notes (Signed)
Patient ID: Jordan Collins, male   DOB: 05/16/1976, 37 y.o.   MRN: [id number] Switched from nexium to protonix based on fax request of formulary change.

## 2013-01-25 ENCOUNTER — Emergency Department (HOSPITAL_COMMUNITY)
Admission: EM | Admit: 2013-01-25 | Discharge: 2013-01-25 | Disposition: A | Discharge status: Home / Self Care | Payer: 59 | Source: Home / Self Care | Attending: Family Medicine | Admitting: Family Medicine

## 2013-01-25 ENCOUNTER — Encounter (HOSPITAL_COMMUNITY): Payer: Self-pay | Admitting: *Deleted

## 2013-01-25 ENCOUNTER — Emergency Department (INDEPENDENT_AMBULATORY_CARE_PROVIDER_SITE_OTHER): Payer: 59

## 2013-01-25 DIAGNOSIS — S40012A Contusion of left shoulder, initial encounter: Secondary | ICD-10-CM

## 2013-01-25 DIAGNOSIS — S40019A Contusion of unspecified shoulder, initial encounter: Secondary | ICD-10-CM

## 2013-01-25 MED ORDER — DICLOFENAC POTASSIUM 50 MG PO TABS: 50.0000 mg | ORAL_TABLET | Freq: Three times a day (TID) | ORAL | Status: DC

## 2013-01-25 NOTE — ED Provider Notes (Signed)
History     CSN: [id number]  Arrival date & time 01/25/13  1700   First MD Initiated Contact with Patient 01/25/13 1701      Chief Complaint  Patient presents with  . Shoulder Pain    (Consider location/radiation/quality/duration/timing/severity/associated sxs/prior treatment) Patient is a 37 y.o. male presenting with shoulder pain. The history is provided by the patient.  Shoulder Pain This is a new problem. The current episode started 6 to 12 hours ago (fell out of bed this am onto front of left shoulder, sore since.). The problem has been gradually improving.    Past Medical History  Diagnosis Date  . Asthma     as a child  . OSA (obstructive sleep apnea) 09/21/2011  . GERD (gastroesophageal reflux disease)     History reviewed. No pertinent past surgical history.  Family History  Problem Relation Age of Onset  . Heart disease Paternal Grandfather   . Stroke Paternal Grandfather   . Liver cancer Father   . Diabetes Father   . Hypertension Father   . Hyperlipidemia Father   . Heart disease Other     heart attack    History  Substance Use Topics  . Smoking status: Former Smoker -- 0.50 packs/day for 10 years    Types: Cigarettes    Quit date: 09/19/2011  . Smokeless tobacco: Never Used  . Alcohol Use: 1.8 oz/week    3 Cans of beer per week      Review of Systems  Constitutional: Negative.   Musculoskeletal: Negative for back pain and joint swelling.  Skin: Negative.     Allergies  Review of patient's allergies indicates no known allergies.  Home Medications   Current Outpatient Rx  Name  Route  Sig  Dispense  Refill  . aspirin 81 MG tablet   Oral   Take 81 mg by mouth daily.         . cyclobenzaprine (FLEXERIL) 10 MG tablet   Oral   Take 1 tablet (10 mg total) by mouth 3 (three) times daily as needed.   30 tablet   3   . ibuprofen (ADVIL,MOTRIN) 800 MG tablet   Oral   Take 1 tablet (800 mg total) by mouth every 8 (eight) hours as needed  for pain.   100 tablet   2   . Nutritional Supplements (EQUATE PO)   Oral   Take 2 tablets by mouth daily.          . diclofenac (CATAFLAM) 50 MG tablet   Oral   Take 1 tablet (50 mg total) by mouth 3 (three) times daily.   30 tablet   0   . EPINEPHrine (EPI-PEN) 0.3 mg/0.3 mL DEVI   Intramuscular   Inject 0.3 mLs (0.3 mg total) into the muscle as needed. immediately if stung by yellowjacket   2 Device   0   . mometasone (NASONEX) 50 MCG/ACT nasal spray   Nasal   Place 2 sprays into the nose daily.   17 g   12   . pantoprazole (PROTONIX) 40 MG tablet   Oral   Take 1 tablet (40 mg total) by mouth daily.   30 tablet   3     BP 130/76  Pulse 80  Temp(Src) 97.8 F (36.6 C) (Oral)  Resp 16  SpO2 100%  Physical Exam  Nursing note and vitals reviewed. Constitutional: He is oriented to person, place, and time. He appears well-developed and well-nourished.  Musculoskeletal: He exhibits tenderness.  Left shoulder: He exhibits decreased range of motion, tenderness and pain. He exhibits no bony tenderness, no swelling, no crepitus, no deformity, no spasm, normal pulse and normal strength.       Arms: Neurological: He is alert and oriented to person, place, and time.  Skin: Skin is warm and dry.    ED Course  Procedures (including critical care time)  Labs Reviewed - No data to display Dg Shoulder Left  01/25/2013  *RADIOLOGY REPORT*  Clinical Data: Shoulder pain  LEFT SHOULDER - 2+ VIEW  Comparison: None  Findings: There is no evidence of fracture or dislocation.  There is no evidence of arthropathy or other focal bone abnormality. Soft tissues are unremarkable.  IMPRESSION: Negative exam.   Original Report Authenticated By: Signa Kell, M.D.      1. Contusion of shoulder, left, initial encounter       MDM          Linna Hoff, MD 01/25/13 [phone]

## 2013-01-25 NOTE — ED Notes (Signed)
C/o L shoulder pain onset this AM after he fell out of bed.  Pain is on the ant.shoulder.  Decreased ROM due to pain.  Appears slightly red on front of shoulder, but no obvious swelling.

## 2013-03-27 ENCOUNTER — Other Ambulatory Visit: Payer: Self-pay | Admitting: Family Medicine

## 2013-03-27 DIAGNOSIS — M545 Low back pain, unspecified: Secondary | ICD-10-CM

## 2013-03-29 NOTE — Assessment & Plan Note (Signed)
Refilled

## 2013-04-26 ENCOUNTER — Ambulatory Visit (INDEPENDENT_AMBULATORY_CARE_PROVIDER_SITE_OTHER): Payer: 59 | Admitting: Family Medicine

## 2013-04-26 ENCOUNTER — Encounter: Payer: Self-pay | Admitting: Family Medicine

## 2013-04-26 VITALS — BP 130/59 | HR 78 | Temp 98.6°F | Ht 67.0 in | Wt 217.0 lb

## 2013-04-26 DIAGNOSIS — Z0000 Encounter for general adult medical examination without abnormal findings: Secondary | ICD-10-CM

## 2013-04-26 DIAGNOSIS — M545 Low back pain, unspecified: Secondary | ICD-10-CM

## 2013-04-26 DIAGNOSIS — E669 Obesity, unspecified: Secondary | ICD-10-CM

## 2013-04-26 DIAGNOSIS — K219 Gastro-esophageal reflux disease without esophagitis: Secondary | ICD-10-CM

## 2013-04-26 DIAGNOSIS — G4733 Obstructive sleep apnea (adult) (pediatric): Secondary | ICD-10-CM

## 2013-04-26 DIAGNOSIS — R079 Chest pain, unspecified: Secondary | ICD-10-CM

## 2013-04-26 DIAGNOSIS — M79671 Pain in right foot: Secondary | ICD-10-CM

## 2013-04-26 DIAGNOSIS — M67919 Unspecified disorder of synovium and tendon, unspecified shoulder: Secondary | ICD-10-CM

## 2013-04-26 DIAGNOSIS — M79609 Pain in unspecified limb: Secondary | ICD-10-CM

## 2013-04-26 DIAGNOSIS — M7542 Impingement syndrome of left shoulder: Secondary | ICD-10-CM

## 2013-04-26 MED ORDER — PANTOPRAZOLE SODIUM 40 MG PO TBEC: 40.0000 mg | DELAYED_RELEASE_TABLET | Freq: Every day | ORAL | Status: DC

## 2013-04-26 NOTE — Patient Instructions (Addendum)
Biggest issues for your health belong to you. Eat right and get the weight down.   Don't forget the exercises to strengthen your back to help the shoulder.   Call me if you need the flexeril or any other med. Once a year is fine. With your family history, it shows how important lifestyle things are.  The biggest thing you have done for health is quitting smoking.  Keep working on the lifestyle.

## 2013-04-27 NOTE — Assessment & Plan Note (Signed)
Recommend muscle strenthening exercises and improved posture.  Knows this is a chronic condition.

## 2013-04-27 NOTE — Assessment & Plan Note (Signed)
Stable on CPAP 

## 2013-04-27 NOTE — Assessment & Plan Note (Signed)
Stable and I am convinced non cardiac.  Focus on diet and stress reduction.

## 2013-04-27 NOTE — Assessment & Plan Note (Signed)
Continue current icing and stretching.

## 2013-04-27 NOTE — Progress Notes (Signed)
  Subjective:    Patient ID: Jordan Collins, male    DOB: [birth year], 37 y.o.   MRN: [id number]  HPI Annual follow up.  He has several problems: GERD - improved when on good diet.  Poorly controled by PPI at present. Low back pain - chronic and OK on current meds. Left shoulder pain and crepitis.  Controled by same meds as back pain. Obesity - was able to change diet and get down under 200lbs.  Weight has rebounded some but he is still lower than last visit.   HPDP - largely up to date. Non smoker. Lots of stress and long hours of work.  Feels stress plays a role in symptoms. + chest tightness - he relates to stress and or GERD.  Did have ETT last year which was normal.  Still with plantar fascitis on Right.       Review of Systems Denies SOB, bowel or appetite changes, bleeding, skin lesions.     Objective:   Physical ExamHEENT nl Neck supple without mass Lungs clear Cardiac RRR without m or g Abd benign Ext WNL except left shoulder with sig crepitus with ROM.  Nl strength and ROM Neuro WNL        Assessment & Plan:

## 2013-04-27 NOTE — Assessment & Plan Note (Signed)
Will focus on diet and not change meds.

## 2013-04-27 NOTE — Assessment & Plan Note (Signed)
Generally good health and habits.  Main problem is obesity.  He plans to get back to his previously successful diet.

## 2013-04-27 NOTE — Assessment & Plan Note (Signed)
Stable on meds  

## 2013-04-27 NOTE — Assessment & Plan Note (Signed)
The root of most of his problems.  Strongly encouraged to resume his previously successful diet.

## 2013-06-14 ENCOUNTER — Other Ambulatory Visit: Payer: Self-pay | Admitting: Family Medicine

## 2013-06-14 DIAGNOSIS — M545 Low back pain, unspecified: Secondary | ICD-10-CM

## 2013-06-14 NOTE — Telephone Encounter (Signed)
Pt called because he would like to start taking flexeril again, the ibuprofen is just not helping with his back pain. He said the Dr. Leveda Anna said if he needed to be back on this that he would refill that medication. Also he would like to have refills sent to the pharmacy of ibuprofen. JW

## 2013-06-20 MED ORDER — IBUPROFEN 800 MG PO TABS: 800.0000 mg | ORAL_TABLET | Freq: Three times a day (TID) | ORAL | Status: DC | PRN

## 2013-06-20 MED ORDER — CYCLOBENZAPRINE HCL 10 MG PO TABS: ORAL_TABLET | ORAL | Status: DC

## 2013-06-28 ENCOUNTER — Ambulatory Visit: Payer: 59 | Admitting: Family Medicine

## 2013-10-27 ENCOUNTER — Other Ambulatory Visit: Payer: Self-pay | Admitting: Family Medicine

## 2013-12-27 ENCOUNTER — Ambulatory Visit: Payer: 59 | Admitting: Family Medicine

## 2013-12-29 ENCOUNTER — Ambulatory Visit (INDEPENDENT_AMBULATORY_CARE_PROVIDER_SITE_OTHER): Payer: 59 | Admitting: Family Medicine

## 2013-12-29 ENCOUNTER — Encounter: Payer: Self-pay | Admitting: Family Medicine

## 2013-12-29 VITALS — BP 124/78 | HR 84 | Temp 98.1°F | Ht 69.0 in | Wt 227.8 lb

## 2013-12-29 DIAGNOSIS — E781 Pure hyperglyceridemia: Secondary | ICD-10-CM

## 2013-12-29 DIAGNOSIS — R7401 Elevation of levels of liver transaminase levels: Secondary | ICD-10-CM

## 2013-12-29 DIAGNOSIS — R7402 Elevation of levels of lactic acid dehydrogenase (LDH): Secondary | ICD-10-CM

## 2013-12-29 DIAGNOSIS — N529 Male erectile dysfunction, unspecified: Secondary | ICD-10-CM

## 2013-12-29 DIAGNOSIS — R740 Nonspecific elevation of levels of transaminase and lactic acid dehydrogenase [LDH]: Secondary | ICD-10-CM

## 2013-12-29 DIAGNOSIS — J209 Acute bronchitis, unspecified: Secondary | ICD-10-CM | POA: Insufficient documentation

## 2013-12-29 DIAGNOSIS — Z23 Encounter for immunization: Secondary | ICD-10-CM

## 2013-12-29 DIAGNOSIS — E669 Obesity, unspecified: Secondary | ICD-10-CM

## 2013-12-29 DIAGNOSIS — M545 Low back pain, unspecified: Secondary | ICD-10-CM

## 2013-12-29 DIAGNOSIS — Z87891 Personal history of nicotine dependence: Secondary | ICD-10-CM

## 2013-12-29 LAB — LIPID PANEL
CHOL/HDL RATIO: 5 ratio
Cholesterol: 176 mg/dL (ref 0–200)
HDL: 35 mg/dL — AB (ref >39)
LDL CALC: 96 mg/dL (ref 0–99)
Triglycerides: 224 mg/dL — ABNORMAL HIGH (ref <150)
VLDL: 45 mg/dL — AB (ref 0–40)

## 2013-12-29 LAB — COMPLETE METABOLIC PANEL WITH GFR
ALK PHOS: 74 U/L (ref 39–117)
ALT: 68 U/L — AB (ref 0–53)
AST: 39 U/L — ABNORMAL HIGH (ref 0–37)
Albumin: 4.3 g/dL (ref 3.5–5.2)
BILIRUBIN TOTAL: 0.5 mg/dL (ref 0.2–1.2)
BUN: 11 mg/dL (ref 6–23)
CO2: 24 mEq/L (ref 19–32)
Calcium: 9.3 mg/dL (ref 8.4–10.5)
Chloride: 102 mEq/L (ref 96–112)
Creat: 0.79 mg/dL (ref 0.50–1.35)
GFR, Est African American: >89 mL/min
Glucose, Bld: 94 mg/dL (ref 70–99)
Potassium: 4 mEq/L (ref 3.5–5.3)
SODIUM: 136 meq/L (ref 135–145)
TOTAL PROTEIN: 7.2 g/dL (ref 6.0–8.3)

## 2013-12-29 MED ORDER — CYCLOBENZAPRINE HCL 10 MG PO TABS: ORAL_TABLET | ORAL | Status: DC

## 2013-12-29 MED ORDER — AZITHROMYCIN 500 MG PO TABS: 500.0000 mg | ORAL_TABLET | Freq: Every day | ORAL | Status: DC

## 2013-12-29 NOTE — Assessment & Plan Note (Signed)
Given 3 week duration, will rx with antibiotics.

## 2013-12-29 NOTE — Patient Instructions (Addendum)
I am giving you an antibiotic to clear up this chest/sinus infection. Of course, see me for a chest X ray if your symptoms don't clear up. Great that you have quit smoking again. Work on the weight next. I will call with blood test results.

## 2013-12-29 NOTE — Assessment & Plan Note (Signed)
Relapse then quit again.  Counseled to stay quit.

## 2013-12-29 NOTE — Assessment & Plan Note (Signed)
Check testosterone.  Advocate for weight loss and exercise.

## 2013-12-29 NOTE — Progress Notes (Signed)
   Subjective:    Patient ID: Jordan Collins, male    DOB: 09-14-1976, 38 y.o.   MRN: [id number]  HPI  Three week hx of worsening cough and nasal congestion.  Has even coughed up blood.  He had backslid and started smoking again - but has now been quit x 3 weeks.   Wt is up a bit - has been eating more since quit smoking Wants testosterone level checked because he is having problems with ED.    Review of Systems     Objective:   Physical ExamTMs clear Nose inflamed turbinates Throat mild injection Neck no sig nodes Lungs clear.        Assessment & Plan:

## 2013-12-29 NOTE — Assessment & Plan Note (Signed)
Worsened.  Diet and exercise.

## 2013-12-29 NOTE — Assessment & Plan Note (Signed)
Fasting today, check labs.

## 2013-12-30 LAB — TESTOSTERONE: Testosterone: 273 ng/dL — ABNORMAL LOW (ref 300–890)

## 2014-01-02 DIAGNOSIS — K7460 Unspecified cirrhosis of liver: Secondary | ICD-10-CM | POA: Insufficient documentation

## 2014-01-02 NOTE — Assessment & Plan Note (Addendum)
New, mild transaminase elevation on blood work.  Alcohol intake is 3 beers per week or less.  No excessive tylenol use.  No OTC meds.  Will investigate further.  Start with labs.  Consider ultrasound.    Patient informed.

## 2014-01-02 NOTE — Addendum Note (Signed)
Addended by: Zenia Resides on: 01/02/2014 02:08 PM   Modules accepted: Orders

## 2014-01-03 ENCOUNTER — Other Ambulatory Visit: Payer: 59

## 2014-01-03 DIAGNOSIS — R740 Nonspecific elevation of levels of transaminase and lactic acid dehydrogenase [LDH]: Principal | ICD-10-CM

## 2014-01-03 DIAGNOSIS — R7401 Elevation of levels of liver transaminase levels: Secondary | ICD-10-CM

## 2014-01-03 NOTE — Progress Notes (Signed)
HBsAg,HepCAb and ferritin done today Jordan Collins

## 2014-01-04 LAB — HEPATITIS C ANTIBODY, REFLEX: HCV Ab: NEGATIVE

## 2014-01-04 LAB — FERRITIN: FERRITIN: 168 ng/mL (ref 22–322)

## 2014-01-04 LAB — HEPATITIS B SURFACE ANTIGEN: Hepatitis B Surface Ag: NEGATIVE

## 2014-05-01 ENCOUNTER — Encounter: Payer: Self-pay | Admitting: Family Medicine

## 2014-05-01 ENCOUNTER — Ambulatory Visit (INDEPENDENT_AMBULATORY_CARE_PROVIDER_SITE_OTHER): Payer: 59 | Admitting: Family Medicine

## 2014-05-01 VITALS — BP 138/90 | HR 86 | Temp 98.6°F | Ht 69.0 in | Wt 236.0 lb

## 2014-05-01 DIAGNOSIS — T63461A Toxic effect of venom of wasps, accidental (unintentional), initial encounter: Secondary | ICD-10-CM

## 2014-05-01 DIAGNOSIS — T6391XA Toxic effect of contact with unspecified venomous animal, accidental (unintentional), initial encounter: Secondary | ICD-10-CM

## 2014-05-01 MED ORDER — CEPHALEXIN 500 MG PO CAPS: 500.0000 mg | ORAL_CAPSULE | Freq: Four times a day (QID) | ORAL | Status: DC

## 2014-05-01 NOTE — Assessment & Plan Note (Signed)
Patient with initial local reaction from yellow jacket sting. No systemic symptoms relating to this. Now appears to be secondarily infected possibly relating to the patient manipulating the area. Duoderm dressing was applied to the area. To be left in place for 1 week and then replaced if central scab not healed. Will treat with keflex 500 mg QID for 5 days for cellulitic infection. Return if not improving. Given return precautions. F/u prn.

## 2014-05-01 NOTE — Progress Notes (Signed)
Patient ID: Jordan Collins, male   DOB: 25-May-1976, 38 y.o.   MRN: [id number]  Tommi Rumps, MD Phone: [phone]  Jordan Collins is a 38 y.o. male who presents today for same day appointment.  Yellow jacket sting: patient reports he was stung on Friday. He was outside and brushed into a branch. A yellow jacket flew up his pant leg and stung him twice on the right shin. He noted pain with this and subsequent erythema in 2 areas. He notes as a child being stung by 57 yellow jackets and being given an epi pen, though he has not had a prior systemic allergic reaction. He did not have any throat swelling or trouble breathing with this. He did not have to use his epi pen. He notes he cleaned the area with peroxide and put antibiotic ointment on it. These interventions have not helped. He noted that the redness improved initially, though he woke up on Monday and noted that the redness had expanded and that there was more pain in the area. He denies fevers and chills.  Patient is a former smoker.   ROS: Per HPI   Physical Exam Filed Vitals:   05/01/14 1007  BP: 138/90  Pulse: 86  Temp: 98.6 F (37 C)     Gen: Well NAD Skin: right mid shin with area of erythema 6.5 cm x 7 cm with central scabbing, there is mild warmth to the area, there is tenderness to the erythematous area, it is difficult to appreciate any induration given this is overlying bone, there is no drainage from the area   Assessment/Plan: Please see individual problem list.  # Healthcare maintenance: up to date

## 2014-05-01 NOTE — Patient Instructions (Addendum)
Nice to meet you. It appears that you likely have a skin infection related to the yellow jacket sting. Please take the antibiotics prescribed four times a day for the next 5 days. Please keep the dressing in place for the next week. If the scab in not healed at that time please place the replacement dressing on the area after one week. If the redness spreads, the pain worsens, you develop fever, nausea, or chills please seek medical attention. Please let us know if there is no improvement with the antibiotics.

## 2014-05-14 ENCOUNTER — Other Ambulatory Visit: Payer: Self-pay | Admitting: Family Medicine

## 2014-08-15 ENCOUNTER — Encounter: Payer: Self-pay | Admitting: Family Medicine

## 2014-08-15 ENCOUNTER — Ambulatory Visit (INDEPENDENT_AMBULATORY_CARE_PROVIDER_SITE_OTHER): Payer: 59 | Admitting: Family Medicine

## 2014-08-15 VITALS — BP 147/78 | HR 95 | Temp 98.3°F | Ht 69.0 in | Wt 248.6 lb

## 2014-08-15 DIAGNOSIS — Z23 Encounter for immunization: Secondary | ICD-10-CM

## 2014-08-15 DIAGNOSIS — G5602 Carpal tunnel syndrome, left upper limb: Secondary | ICD-10-CM | POA: Insufficient documentation

## 2014-08-15 DIAGNOSIS — E669 Obesity, unspecified: Secondary | ICD-10-CM

## 2014-08-15 DIAGNOSIS — N451 Epididymitis: Secondary | ICD-10-CM

## 2014-08-15 LAB — TSH: TSH: 1.084 u[IU]/mL (ref 0.350–4.500)

## 2014-08-15 MED ORDER — AZITHROMYCIN 500 MG PO TABS: 500.0000 mg | ORAL_TABLET | Freq: Every day | ORAL | Status: DC

## 2014-08-15 NOTE — Patient Instructions (Signed)
google two things Carpal tunnel syndrome Epididymitis Work hard on increased exercise and decrease calorie intake.  Let me know if you wish to see a nutritionist. Consider surgery for weight loss if all else fails.   Use the brace at least at night, maybe during the day. I will call with the thyroid test results Pick up the antibiotic prescription for the epididymitis.   And you get a flu shot.

## 2014-08-15 NOTE — Assessment & Plan Note (Signed)
Stressed diet and exercise again.  Offered nutrition referral, he declined

## 2014-08-16 NOTE — Progress Notes (Signed)
   Subjective:    Patient ID: Jordan Collins, male    DOB: 1976/06/29, 38 y.o.   MRN: [id number]  HPI Multiple issues: Cough, mild chest congestion and left sided pain which hurts with movement present for 2 weeks.  Slowly improving.  No risk factors for PE. Intermittent left testicular pain for ~2 months.  No fever.  Occasional terminal secretions after voiding.  No true penile discharge.  No dysuria.  Monogamous.   Wt loss has been unsuccessful.  He finds it hard working two jobs, both as a Biomedical scientist.  Does not feel seeing a nutritionist would help at this point.  He recognizes that he could exercise more.   Intermittent left hand numbness, palmar side.  Some wrist pain at night.  Uses hands a lot in food prep.  Rt handed.  Never carpal tunnel before.   Needs flu shot     Review of Systems     Objective:   Physical Exam VS noted Lungs clear Reproducable chest pain on palpation of Left lat ribs Cardiac RRR without m or g Abd benign Testicles. Neither tender today.  Left testicle is a little boggy.        Assessment & Plan:

## 2014-08-16 NOTE — Assessment & Plan Note (Signed)
Given splint and instructed.

## 2014-08-16 NOTE — Assessment & Plan Note (Signed)
Not classic but worth a trial of antibiotic treatment.

## 2014-10-01 ENCOUNTER — Other Ambulatory Visit: Payer: Self-pay | Admitting: Family Medicine

## 2014-10-15 ENCOUNTER — Ambulatory Visit (INDEPENDENT_AMBULATORY_CARE_PROVIDER_SITE_OTHER): Payer: 59 | Admitting: *Deleted

## 2014-10-15 DIAGNOSIS — Z23 Encounter for immunization: Secondary | ICD-10-CM

## 2015-03-08 ENCOUNTER — Other Ambulatory Visit: Payer: Self-pay | Admitting: Family Medicine

## 2015-06-03 ENCOUNTER — Other Ambulatory Visit: Payer: Self-pay | Admitting: Family Medicine

## 2017-04-13 ENCOUNTER — Encounter: Payer: Self-pay | Admitting: Student

## 2017-04-13 ENCOUNTER — Ambulatory Visit (INDEPENDENT_AMBULATORY_CARE_PROVIDER_SITE_OTHER): Payer: BLUE CROSS/BLUE SHIELD | Admitting: Student

## 2017-04-13 DIAGNOSIS — B9789 Other viral agents as the cause of diseases classified elsewhere: Secondary | ICD-10-CM | POA: Diagnosis not present

## 2017-04-13 DIAGNOSIS — J069 Acute upper respiratory infection, unspecified: Secondary | ICD-10-CM | POA: Diagnosis not present

## 2017-04-13 DIAGNOSIS — L0390 Cellulitis, unspecified: Secondary | ICD-10-CM | POA: Insufficient documentation

## 2017-04-13 DIAGNOSIS — L03116 Cellulitis of left lower limb: Secondary | ICD-10-CM

## 2017-04-13 MED ORDER — CEPHALEXIN 500 MG PO CAPS: 500.0000 mg | ORAL_CAPSULE | Freq: Four times a day (QID) | ORAL | 0 refills | Status: DC

## 2017-04-13 NOTE — Assessment & Plan Note (Signed)
History and physical exam consistent with viral URI - continue conservative measures - follow as needed

## 2017-04-13 NOTE — Patient Instructions (Signed)
Follow up as needed Take Alka seltzer cold and flu for cold symptoms Take Keflex antibiotic for the rash on your leg Call the office with questions or concerns

## 2017-04-13 NOTE — Progress Notes (Signed)
   Subjective:    Patient ID: Jordan Collins, male    DOB: 1976/06/16, 41 y.o.   MRN: [id number]   CC: concern for cold  HPI: 41 y/o M presents for concern for cold  Cold - 3 days ago had fever to Tmax 103 - he had cough with mild scratchy throat associated - no SOB or chest pain - too dayquil x1 for this and last took tylenol yesterday - he feels improved today  Rash on left leg - he states he was bitten by an insect several days ago and has been using antibiotic ointment on the bumps - he feels they are improving  Smoking status reviewed  Review of Systems  Per HPI,    Objective:  BP 132/90   Pulse 92   Temp 98.8 F (37.1 C) (Oral)   Ht 5\' 9"  (1.753 m)   Wt 235 lb (106.6 kg)   SpO2 97%   BMI 34.70 kg/m  Vitals and nursing note reviewed  General: NAD HEENT: normal oropharynx, normal bilateral ear canals and TMs Cardiac: RRR, Respiratory: CTAB, normal effort Skin: Left lower leg with two crusting lesions approximately 1 cm in diameter with surrounding erythema, multiple healed similar lesions over bilateral lower legs, else warm and dry, no rashes noted Neuro: alert and oriented, no focal deficits   Assessment & Plan:    Viral URI with cough History and physical exam consistent with viral URI - continue conservative measures - follow as needed  Cellulitis Two lesions on legs with surrounding erythema consistent with cellulitis. Given his has multiple similar lesions in his legs there is concern these may be self inflicted - keflex for cellulitis - follow with PCP    Sansa Alkema A. Lincoln Brigham MD, [facility] Resident PGY-3 Pager [phone]

## 2017-04-13 NOTE — Assessment & Plan Note (Signed)
Two lesions on legs with surrounding erythema consistent with cellulitis. Given his has multiple similar lesions in his legs there is concern these may be self inflicted - keflex for cellulitis - follow with PCP

## 2017-06-03 ENCOUNTER — Ambulatory Visit (INDEPENDENT_AMBULATORY_CARE_PROVIDER_SITE_OTHER): Payer: BLUE CROSS/BLUE SHIELD | Admitting: Family Medicine

## 2017-06-03 ENCOUNTER — Encounter: Payer: Self-pay | Admitting: Family Medicine

## 2017-06-03 VITALS — BP 138/84 | HR 87 | Temp 98.2°F | Ht 69.0 in | Wt 234.0 lb

## 2017-06-03 DIAGNOSIS — R7401 Elevation of levels of liver transaminase levels: Secondary | ICD-10-CM

## 2017-06-03 DIAGNOSIS — R740 Nonspecific elevation of levels of transaminase and lactic acid dehydrogenase [LDH]: Secondary | ICD-10-CM

## 2017-06-03 DIAGNOSIS — E781 Pure hyperglyceridemia: Secondary | ICD-10-CM

## 2017-06-03 DIAGNOSIS — K219 Gastro-esophageal reflux disease without esophagitis: Secondary | ICD-10-CM

## 2017-06-03 DIAGNOSIS — R7989 Other specified abnormal findings of blood chemistry: Secondary | ICD-10-CM | POA: Diagnosis not present

## 2017-06-03 DIAGNOSIS — G5602 Carpal tunnel syndrome, left upper limb: Secondary | ICD-10-CM

## 2017-06-03 DIAGNOSIS — Z114 Encounter for screening for human immunodeficiency virus [HIV]: Secondary | ICD-10-CM | POA: Diagnosis not present

## 2017-06-03 MED ORDER — PANTOPRAZOLE SODIUM 40 MG PO TBEC: 40.0000 mg | DELAYED_RELEASE_TABLET | Freq: Every day | ORAL | 3 refills | Status: DC

## 2017-06-03 MED FILL — PANTOPRAZOLE SOD DR 40 MG T: 40 | 90 days supply | Qty: 90 | Fill #0

## 2017-06-03 NOTE — Patient Instructions (Signed)
Lots of blood work.  I will call with results. Rx sent for protonix. You have the Rx for the wrist splint. I am trying to figure out which drug to treat the ED with. Please get back in the gym.  With your family history of heart problems, you need to take care of yourself. For now, tylenol, not aleve for pains.

## 2017-06-04 LAB — CMP14+EGFR
ALT: 50 IU/L — ABNORMAL HIGH (ref 0–44)
AST: 41 IU/L — ABNORMAL HIGH (ref 0–40)
Albumin/Globulin Ratio: 1.5 (ref 1.2–2.2)
Albumin: 4.4 g/dL (ref 3.5–5.5)
Alkaline Phosphatase: 84 IU/L (ref 39–117)
BUN / CREAT RATIO: 11 (ref 9–20)
BUN: 10 mg/dL (ref 6–24)
Bilirubin Total: 0.6 mg/dL (ref 0.0–1.2)
CALCIUM: 9.6 mg/dL (ref 8.7–10.2)
CO2: 22 mmol/L (ref 20–29)
CREATININE: 0.9 mg/dL (ref 0.76–1.27)
Chloride: 105 mmol/L (ref 96–106)
GFR, EST AFRICAN AMERICAN: 123 mL/min/{1.73_m2} (ref >59)
GFR, EST NON AFRICAN AMERICAN: 106 mL/min/{1.73_m2} (ref >59)
GLOBULIN, TOTAL: 2.9 g/dL (ref 1.5–4.5)
Glucose: 93 mg/dL (ref 65–99)
Potassium: 4.2 mmol/L (ref 3.5–5.2)
SODIUM: 140 mmol/L (ref 134–144)
TOTAL PROTEIN: 7.3 g/dL (ref 6.0–8.5)

## 2017-06-04 LAB — CBC
Hematocrit: 44 % (ref 37.5–51.0)
Hemoglobin: 15 g/dL (ref 13.0–17.7)
MCH: 27.9 pg (ref 26.6–33.0)
MCHC: 34.1 g/dL (ref 31.5–35.7)
MCV: 82 fL (ref 79–97)
PLATELETS: 256 10*3/uL (ref 150–379)
RBC: 5.38 x10E6/uL (ref 4.14–5.80)
RDW: 14.1 % (ref 12.3–15.4)
WBC: 8.2 10*3/uL (ref 3.4–10.8)

## 2017-06-04 LAB — LIPID PANEL
CHOL/HDL RATIO: 5 ratio (ref 0.0–5.0)
Cholesterol, Total: 155 mg/dL (ref 100–199)
HDL: 31 mg/dL — ABNORMAL LOW (ref >39)
LDL CALC: 79 mg/dL (ref 0–99)
TRIGLYCERIDES: 225 mg/dL — AB (ref 0–149)
VLDL Cholesterol Cal: 45 mg/dL — ABNORMAL HIGH (ref 5–40)

## 2017-06-04 LAB — HIV ANTIBODY (ROUTINE TESTING W REFLEX): HIV Screen 4th Generation wRfx: NONREACTIVE

## 2017-06-04 LAB — TESTOSTERONE: TESTOSTERONE: 212 ng/dL — AB (ref 264–916)

## 2017-06-05 ENCOUNTER — Encounter: Payer: Self-pay | Admitting: Family Medicine

## 2017-06-05 NOTE — Assessment & Plan Note (Signed)
Given rx for brace

## 2017-06-05 NOTE — Assessment & Plan Note (Signed)
Likely cause of ED.  Recheck testosterone.  If still low, treat with testosterone.  If OK, trial of viagra.

## 2017-06-05 NOTE — Progress Notes (Signed)
   Subjective:    Patient ID: Jordan Collins, male    DOB: [birth year], 41 y.o.   MRN: [id number]  HPI Visit type requested was "a physical."  However, patient has not been seen in almost three years because he lost his insurance.  Several large issues. 1. Significant GERD sx.  He had lost weight down to 209 and sx improved but did not fully resolove.  When he lost his job, he could not afford gym membership and wt ballooned 25 lbs.  He is now committed to getting back to the gym 2. ED.  Previously documented low testosterone.  Wants help. 3. Lifestyle. Quit tobacco 2015.  Was a heavy drinker but has also quit alcohol.  Plans to get back to the gym. 4. Left hand numbness.  Previously diagnosed with carpal tunnel.  Wants another splint.  His is worn out. 5. Previous hypertriglyceridemia. 6. Previous transaminitis. Hep B surface antigen neg 2015.  ferritan normal 2015.  Hep C neg 2015.  Previous heavy drinker. Needs recheck.    Review of Systems Denies chest pain and DOE.  Has low back pain and some recent rit thigh pain.     Objective:   Physical Exam HEENT normal Neck supple  Lungs clear Cardiac RRR without m or g Abd benign. Left wrist pos carpal tunnel testing.        Assessment & Plan:

## 2017-06-05 NOTE — Assessment & Plan Note (Signed)
Fasting today.  Check FLP

## 2017-06-05 NOTE — Assessment & Plan Note (Signed)
Quite worried about possibility of cirrhosis given continued elevation.  Will check elastography.

## 2017-06-05 NOTE — Assessment & Plan Note (Signed)
Stay on PPI.  Check CBC

## 2017-06-08 ENCOUNTER — Telehealth: Payer: Self-pay

## 2017-06-08 NOTE — Telephone Encounter (Signed)
LVM for pt to call the office. Calling to inform pt that GI will be calling to schedule [place]. He should hear from them today. Ottis Stain, CMA

## 2017-06-08 NOTE — Addendum Note (Signed)
Addended by: Londell Moh T on: 06/08/2017 11:57 AM   Modules accepted: Orders

## 2017-06-08 NOTE — Telephone Encounter (Signed)
Spoke to and informed pt. Jordan Collins Jordan Collins, CMA  

## 2017-06-09 NOTE — Telephone Encounter (Signed)
[place] appt scheduled. Ottis Stain, CMA

## 2017-06-09 NOTE — Telephone Encounter (Signed)
error 

## 2017-06-09 NOTE — Telephone Encounter (Deleted)
error 

## 2017-06-10 ENCOUNTER — Ambulatory Visit
Admission: RE | Admit: 2017-06-10 | Discharge: 2017-06-10 | Disposition: A | Discharge status: Home / Self Care | Payer: BLUE CROSS/BLUE SHIELD | Source: Ambulatory Visit | Attending: Family Medicine | Admitting: Family Medicine

## 2017-06-10 DIAGNOSIS — R740 Nonspecific elevation of levels of transaminase and lactic acid dehydrogenase [LDH]: Secondary | ICD-10-CM | POA: Diagnosis not present

## 2017-06-10 DIAGNOSIS — R945 Abnormal results of liver function studies: Secondary | ICD-10-CM | POA: Diagnosis not present

## 2017-06-10 DIAGNOSIS — R7401 Elevation of levels of liver transaminase levels: Secondary | ICD-10-CM

## 2017-06-17 ENCOUNTER — Encounter: Payer: Self-pay | Admitting: Family Medicine

## 2017-06-17 ENCOUNTER — Encounter: Payer: Self-pay | Admitting: Internal Medicine

## 2017-06-17 ENCOUNTER — Telehealth: Payer: Self-pay | Admitting: *Deleted

## 2017-06-17 ENCOUNTER — Ambulatory Visit (INDEPENDENT_AMBULATORY_CARE_PROVIDER_SITE_OTHER): Payer: BLUE CROSS/BLUE SHIELD | Admitting: Family Medicine

## 2017-06-17 DIAGNOSIS — Z23 Encounter for immunization: Secondary | ICD-10-CM | POA: Diagnosis not present

## 2017-06-17 DIAGNOSIS — R7989 Other specified abnormal findings of blood chemistry: Secondary | ICD-10-CM | POA: Diagnosis not present

## 2017-06-17 DIAGNOSIS — K7460 Unspecified cirrhosis of liver: Secondary | ICD-10-CM | POA: Diagnosis not present

## 2017-06-17 MED ORDER — TESTOSTERONE 20.25 MG/ACT (1.62%) TD GEL: TRANSDERMAL | 5 refills | Status: DC

## 2017-06-17 NOTE — Telephone Encounter (Signed)
Prior Authorization received from [place] for Androgel 1.62 %. Formulary and PA form placed in provider box for completion. Derl Barrow, RN

## 2017-06-17 NOTE — Telephone Encounter (Signed)
PA form faxed to [facility] for review.  Derl Barrow, RN

## 2017-06-17 NOTE — Telephone Encounter (Signed)
Form completed.

## 2017-06-17 NOTE — Patient Instructions (Signed)
Someone should call within a week about the GI referral.   No more tylenol.   I hope the testosterone replacement helps.

## 2017-06-18 NOTE — Assessment & Plan Note (Signed)
Will replace to get him in normal range.  If still ED, consider add viagra.

## 2017-06-18 NOTE — Assessment & Plan Note (Signed)
GI referral to see if further testing is in order.  My message to him is that we caught this early.

## 2017-06-18 NOTE — Progress Notes (Signed)
Patient ID: Jordan Collins, male   DOB: 1975/10/31, 41 y.o.   MRN: [id number]

## 2017-06-18 NOTE — Progress Notes (Signed)
   Subjective:    Patient ID: Jordan Collins, male    DOB: 08/07/1976, 41 y.o.   MRN: [id number]  HPI  FU liver testing and erectile dysfunction. Liver.  Long standing elevated transaminases.  Lab testing in 2015 did not reveil and etiology other that his previous heavy drinking.  Now clean and sober.  Unfortunately, elastography shows considerable fibrosis.  Currently asymptomatic.  Denies abd distention, leg swelling.  Tests of hepatocellular function are normal.  Erectile dysfunction.  Low testosterone confirmed x 2.  (Note liver disease is associated with low T.)    Review of Systems     Objective:   Physical Exam  Abd benign.        Assessment & Plan:

## 2017-06-29 NOTE — Telephone Encounter (Signed)
Additional information is needed for the PA for androgel. Form placed in provider box for review.  Derl Barrow, RN

## 2017-06-29 NOTE — Telephone Encounter (Signed)
Called [employer] [place] to check the status of PA for androgel 1.62%. They did not receive the PA due to fax number listed on form was incorrect.  Correct fax number given [phone].  Derl Barrow, RN

## 2017-06-30 NOTE — Telephone Encounter (Signed)
PA form faxed back to [facility] [employer] for review.  Derl Barrow, RN

## 2017-06-30 NOTE — Telephone Encounter (Signed)
Form completed.

## 2017-07-05 NOTE — Telephone Encounter (Signed)
PA was denied via [employer] [place]. Patient must have two low testosterone levels collected in the early morning. Patient only had one level submitted and it was not collected in the early morning. Reference number: [id number].  Derl Barrow, RN

## 2017-07-06 NOTE — Addendum Note (Signed)
Addended by: Zenia Resides on: 07/06/2017 05:36 PM   Modules accepted: Orders

## 2017-07-06 NOTE — Telephone Encounter (Signed)
Informed patient and placed future orders.  Patient will come in for lab appointment.

## 2017-07-08 ENCOUNTER — Other Ambulatory Visit: Payer: BLUE CROSS/BLUE SHIELD

## 2017-07-08 DIAGNOSIS — R7989 Other specified abnormal findings of blood chemistry: Secondary | ICD-10-CM | POA: Diagnosis not present

## 2017-07-09 ENCOUNTER — Other Ambulatory Visit: Payer: BLUE CROSS/BLUE SHIELD

## 2017-07-09 DIAGNOSIS — R7989 Other specified abnormal findings of blood chemistry: Secondary | ICD-10-CM | POA: Diagnosis not present

## 2017-07-09 LAB — TESTOSTERONE, FREE, TOTAL, SHBG
Sex Hormone Binding: 24 nmol/L (ref 16.5–55.9)
Testosterone, Free: 10.9 pg/mL (ref 6.8–21.5)
Testosterone: 179 ng/dL — ABNORMAL LOW (ref 264–916)

## 2017-07-10 LAB — TESTOSTERONE, FREE, TOTAL, SHBG
Sex Hormone Binding: 23.3 nmol/L (ref 16.5–55.9)
Testosterone, Free: 10.8 pg/mL (ref 6.8–21.5)
Testosterone: 209 ng/dL — ABNORMAL LOW (ref 264–916)

## 2017-07-13 NOTE — Telephone Encounter (Signed)
PA forms completed for Androgel. Forms placed in provider box to sign and return to triage nurse. Derl Barrow, RN

## 2017-07-13 NOTE — Telephone Encounter (Signed)
Form signed.

## 2017-07-20 ENCOUNTER — Telehealth: Payer: Self-pay | Admitting: Family Medicine

## 2017-07-20 NOTE — Telephone Encounter (Signed)
Pt called because the steroid medication that was PA is still very expensive. It is over $500.00 with discounts and coupons. He had talked to Dr. Andria Frames about the shots and wasn't sure if that is still an option and if that is cheaper. Is there any other options out there that would be cheaper. Please let her know. jw

## 2017-07-20 NOTE — Telephone Encounter (Signed)
Left voice message for patient that PA for androgel 1.62% as of 06/29/2017. Reference number: [id number].  Derl Barrow, RN

## 2017-07-27 NOTE — Telephone Encounter (Signed)
Will investigate

## 2017-07-30 NOTE — Telephone Encounter (Signed)
Our outpatient pharmacy unfortunately cannot compete with the costs of some pharmacies selling this through - GoodRx with coupons is < $200.   He could consider trying another pharmacy and checking cost with a coupon...  OR   Injection is much less expensive < $50 for a 36ml vial which would likely provide multiple months of tx.    I do favor the androgel 1.62%   Like you have prescribed....but insurance is challenging.

## 2017-08-03 MED ORDER — TESTOSTERONE CYPIONATE 100 MG/ML IM SOLN: 100.0000 mg | INTRAMUSCULAR | 0 refills | Status: DC

## 2017-08-03 NOTE — Telephone Encounter (Signed)
Discussed with patient.  He would like to try im testosterone.  Nurse at work can give injections.

## 2017-08-09 ENCOUNTER — Telehealth: Payer: Self-pay | Admitting: *Deleted

## 2017-08-09 NOTE — Telephone Encounter (Signed)
Received message on nurse line from Manuela Schwartz with [facility] outpatient Pharmacy [phone]) requesting change in testosterone from 100 mg/mL to 200 mg/mL 0.5 mL every 14 days. Hubbard Hartshorn, RN, BSN

## 2017-08-10 ENCOUNTER — Encounter (INDEPENDENT_AMBULATORY_CARE_PROVIDER_SITE_OTHER): Payer: Self-pay

## 2017-08-10 ENCOUNTER — Other Ambulatory Visit (INDEPENDENT_AMBULATORY_CARE_PROVIDER_SITE_OTHER): Payer: BLUE CROSS/BLUE SHIELD

## 2017-08-10 ENCOUNTER — Ambulatory Visit (INDEPENDENT_AMBULATORY_CARE_PROVIDER_SITE_OTHER): Payer: BLUE CROSS/BLUE SHIELD | Admitting: Internal Medicine

## 2017-08-10 ENCOUNTER — Encounter: Payer: Self-pay | Admitting: Internal Medicine

## 2017-08-10 VITALS — BP 140/84 | HR 78 | Ht 69.0 in | Wt 236.0 lb

## 2017-08-10 DIAGNOSIS — K219 Gastro-esophageal reflux disease without esophagitis: Secondary | ICD-10-CM | POA: Diagnosis not present

## 2017-08-10 DIAGNOSIS — Z6835 Body mass index (BMI) 35.0-35.9, adult: Secondary | ICD-10-CM | POA: Diagnosis not present

## 2017-08-10 DIAGNOSIS — R1310 Dysphagia, unspecified: Secondary | ICD-10-CM

## 2017-08-10 DIAGNOSIS — R7989 Other specified abnormal findings of blood chemistry: Secondary | ICD-10-CM

## 2017-08-10 DIAGNOSIS — R945 Abnormal results of liver function studies: Secondary | ICD-10-CM

## 2017-08-10 LAB — PROTIME-INR
INR: 1 ratio (ref 0.8–1.0)
Prothrombin Time: 10.7 s (ref 9.6–13.1)

## 2017-08-10 MED ORDER — TESTOSTERONE CYPIONATE 200 MG/ML IM SOLN: 100.0000 mg | INTRAMUSCULAR | 5 refills | Status: DC

## 2017-08-10 NOTE — Telephone Encounter (Signed)
Prior auth questionnaire placed in PCP box, please answer question and return to RN office.

## 2017-08-10 NOTE — Telephone Encounter (Signed)
Made switch as requested.  Now needs a prior auth.  Insurance phone #=[phone].

## 2017-08-10 NOTE — Progress Notes (Signed)
HISTORY OF PRESENT ILLNESS:  Jordan Collins is a 41 y.o. male, [employer] employee, who is referred by his primary care provider Dr. Andria Collins with chief complaint of chronically abnormal liver tests and abnormal liver imaging with concerns for possible hepatic cirrhosis. Patient is accompanied by his wife and mother. Review of outside records has included office notes, laboratories, and imaging. The patient had normal liver tests in 2012. His weight at that time was 215 pounds. The first documented abnormal liver tests were March 2015. ALT slightly greater than AST and less than 1.5 times the upper limit of normal. Normal alkaline phosphatase and bilirubin. Weight at that time 227 pounds. Subsequent liver tests with mild elevated transaminases in a similar pattern. Body weight as high as 257 pounds. Currently 234 pounds. He apparently used to use alcohol on a regular basis, but nothing in 3 years. He tells me that his father had hepatic cirrhosis secondary to alcohol and "liver cancer". The patient was tested previously for chronic hepatitis which was negative. Other laboratories have included normal ferritin, TSH, protein, and albumin. Normal CBC including platelets. Most recent liver tests with SGOT 41, SGPT 50, alkaline phosphatase 84, and total bilirubin 0.6. No problems with edema, mentation, or GI bleeding. Abdominal ultrasound with elastography was performed 06/10/2017. The liver revealed increased echotexture consistent with fatty and fulguration or intrinsic liver disease. No ductal dilation or other abnormality. Normal gallbladder. Unremarkable spleen.Metavir fibrosis score  calculated at some F3 plus F4 translating into high risk for fibrosis. Next, the patient reports chronic GERD. He requires pantoprazole 40 mg daily to control symptoms. Significant reflux off medication. He also has a history of intermittent solid food dysphagia item such as meat. No prior upper endoscopy.  REVIEW OF SYSTEMS:  All non-GI  ROS negative unless otherwise stated in the history of present illness except for visual change  Past Medical History:  Diagnosis Date  . Asthma    as a child  . GERD (gastroesophageal reflux disease)   . OSA (obstructive sleep apnea) 09/21/2011    Past Surgical History:  Procedure Laterality Date  . IMPACTED THIRD MOLAR REMOVAL      Social History Jordan Collins  reports that he quit smoking about 3 years ago. His smoking use included Cigarettes. He has a 5.00 pack-year smoking history. He has never used smokeless tobacco. He reports that he drinks about 1.8 oz of alcohol per week . He reports that he does not use drugs.  family history includes Diabetes in his father; Heart disease in his other and paternal grandfather; Hyperlipidemia in his father; Hypertension in his father; Liver cancer in his father; Stroke in his paternal grandfather.  No Known Allergies     PHYSICAL EXAMINATION: Vital signs: BP 140/84   Pulse 78   Ht 5\' 9"  (1.753 m)   Wt 236 lb (107 kg)   SpO2 98%   BMI 34.85 kg/m   Constitutional: Obese, generally well-appearing, no acute distress Psychiatric: alert and oriented x3, cooperative Eyes: extraocular movements intact, anicteric, conjunctiva pink Mouth: oral pharynx moist, no lesions Neck: supple without thyromegaly Lymph: no lymphadenopathy Cardiovascular: heart regular rate and rhythm, no murmur Lungs: clear to auscultation bilaterally Abdomen: soft, obese, nontender, nondistended, no obvious ascites, no peritoneal signs, normal bowel sounds, no organomegaly. Rectal: Omitted Extremities: no lower extremity edema bilaterally Skin: Multiple tattoos. no additional relevant lesions on visible extremities Neuro: No focal deficits. Cranial nerves intact. No asterixis.   ASSESSMENT:  #1. Chronic mild elevation of hepatic  transaminases. Suspect fatty liver. Rule out other causes. Not convinced that he has cirrhosis as sensitivity and specificity for  imaging varies in terms of reliability. No evidence for portal hypertension clinically or otherwise with normal platelets and spleen size. #2. Abnormal ultrasound as above #3. Chronic GERD #4. Intermittent solid food dysphagia. Rule out peptic stricture #5. Morbid obesity   PLAN:  #1. Expand workup for chronic elevation of hepatic transaminases with multiple non-viral studies, PT/INR, and celiac profile #2. Exercise #3. Weight loss #4. Schedule upper endoscopy screening for varices with possible esophageal dilation to address dysphagia.The nature of the procedure, as well as the risks, benefits, and alternatives were carefully and thoroughly reviewed with the patient. Ample time for discussion and questions allowed. The patient understood, was satisfied, and agreed to proceed. #5. Reflux precautions #6. Continue pantoprazole 40 mg daily #7. Follow-up to be arranged after the above completed  A copy of this consultation note has been sent to Dr. Andria Collins

## 2017-08-10 NOTE — Telephone Encounter (Signed)
Form completed.

## 2017-08-10 NOTE — Patient Instructions (Signed)
Your physician has requested that you go to the basement for lab work before leaving today.  You have been scheduled for an endoscopy. Please follow written instructions given to you at your visit today. If you use inhalers (even only as needed), please bring them with you on the day of your procedure. Your physician has requested that you go to www.startemmi.com and enter the access code given to you at your visit today. This web site gives a general overview about your procedure. However, you should still follow specific instructions given to you by our office regarding your preparation for the procedure.   

## 2017-08-10 NOTE — Telephone Encounter (Signed)
Form completed on-line in Cover My Meds. States it has been received. Hubbard Hartshorn, RN, BSN

## 2017-08-15 LAB — MITOCHONDRIAL ANTIBODIES

## 2017-08-15 LAB — CERULOPLASMIN: CERULOPLASMIN: 24 mg/dL (ref 18–36)

## 2017-08-15 LAB — TISSUE TRANSGLUTAMINASE, IGA: (tTG) Ab, IgA: 1 U/mL

## 2017-08-15 LAB — ANA: ANA: NEGATIVE

## 2017-08-15 LAB — ANTI-SMOOTH MUSCLE ANTIBODY, IGG

## 2017-08-15 LAB — ALPHA-1-ANTITRYPSIN: A1 ANTITRYPSIN SER: 135 mg/dL (ref 83–199)

## 2017-08-17 MED FILL — TESTOSTERONE CYP 200 MG/ML: 200 | 28 days supply | Qty: 2 | Fill #0

## 2017-08-17 NOTE — Telephone Encounter (Signed)
Received approval for testosterone.    [facility] outpatient pharmacy informed. Clinton Sawyer, Salome Spotted, [place]

## 2017-09-22 ENCOUNTER — Encounter: Payer: Self-pay | Admitting: Internal Medicine

## 2017-09-27 ENCOUNTER — Encounter: Payer: BLUE CROSS/BLUE SHIELD | Admitting: Internal Medicine

## 2017-09-29 MED FILL — PANTOPRAZOLE SOD DR 40 MG T: 40 | 90 days supply | Qty: 90 | Fill #1

## 2017-10-04 MED FILL — TESTOSTERONE CYP 200 MG/ML: 200 | 28 days supply | Qty: 2 | Fill #1

## 2017-11-08 ENCOUNTER — Encounter: Payer: Self-pay | Admitting: Internal Medicine

## 2017-11-08 ENCOUNTER — Other Ambulatory Visit: Payer: Self-pay

## 2017-11-08 ENCOUNTER — Ambulatory Visit (AMBULATORY_SURGERY_CENTER): Payer: BLUE CROSS/BLUE SHIELD | Admitting: Internal Medicine

## 2017-11-08 VITALS — BP 127/87 | HR 76 | Temp 98.4°F | Resp 16 | Ht 69.0 in | Wt 236.0 lb

## 2017-11-08 DIAGNOSIS — R1310 Dysphagia, unspecified: Secondary | ICD-10-CM | POA: Diagnosis present

## 2017-11-08 DIAGNOSIS — K222 Esophageal obstruction: Secondary | ICD-10-CM

## 2017-11-08 MED ORDER — SODIUM CHLORIDE 0.9 % IV SOLN: 500.0000 mL | Freq: Once | INTRAVENOUS | Status: DC

## 2017-11-08 NOTE — Progress Notes (Signed)
Called to room to assist during endoscopic procedure.  Patient ID and intended procedure confirmed with present staff. Received instructions for my participation in the procedure from the performing physician.  

## 2017-11-08 NOTE — Patient Instructions (Addendum)
YOU HAD AN ENDOSCOPIC PROCEDURE TODAY AT [facility]:   Refer to the procedure report that was given to you for any specific questions about what was found during the examination.  If the procedure report does not answer your questions, please call your gastroenterologist to clarify.  If you requested that your care partner not be given the details of your procedure findings, then the procedure report has been included in a sealed envelope for you to review at your convenience later.  YOU SHOULD EXPECT: Some feelings of bloating in the abdomen. Passage of more gas than usual.  Walking can help get rid of the air that was put into your GI tract during the procedure and reduce the bloating. If you had a lower endoscopy (such as a colonoscopy or flexible sigmoidoscopy) you may notice spotting of blood in your stool or on the toilet paper. If you underwent a bowel prep for your procedure, you may not have a normal bowel movement for a few days.  Please Note:  You might notice some irritation and congestion in your nose or some drainage.  This is from the oxygen used during your procedure.  There is no need for concern and it should clear up in a day or so.  SYMPTOMS TO REPORT IMMEDIATELY:   Following upper endoscopy (EGD)  Vomiting of blood or coffee ground material  New chest pain or pain under the shoulder blades  Painful or persistently difficult swallowing  New shortness of breath  Fever of 100F or higher  Black, tarry-looking stools  For urgent or emergent issues, a gastroenterologist can be reached at any hour by calling [phone].   DIET:  Follow Post Esophageal Diet.  Drink plenty of fluids but you should avoid alcoholic beverages for 24 hours.  ACTIVITY:  You should plan to take it easy for the rest of today and you should NOT DRIVE or use heavy machinery until tomorrow (because of the sedation medicines used during the test).    FOLLOW UP: Our staff will call the  number listed on your records the next business day following your procedure to check on you and address any questions or concerns that you may have regarding the information given to you following your procedure. If we do not reach you, we will leave a message.  However, if you are feeling well and you are not experiencing any problems, there is no need to return our call.  We will assume that you have returned to your regular daily activities without incident.  If any biopsies were taken you will be contacted by phone or by letter within the next 1-3 weeks.  Please call us at [phone] if you have not heard about the biopsies in 3 weeks.   Post Esophageal Dilation Diet (handout given) Esophagitis and Stricture (handout given) Routine office follow one year   SIGNATURES/CONFIDENTIALITY: You and/or your care partner have signed paperwork which will be entered into your electronic medical record.  These signatures attest to the fact that that the information above on your After Visit Summary has been reviewed and is understood.  Full responsibility of the confidentiality of this discharge information lies with you and/or your care-partner.

## 2017-11-08 NOTE — Progress Notes (Signed)
A and O x3. Report to RN. Tolerated MAC anesthesia well.Teeth unchanged after procedure.

## 2017-11-08 NOTE — Progress Notes (Signed)
Pt's states no medical or surgical changes since previsit or office visit. 

## 2017-11-08 NOTE — Op Note (Signed)
[facility] Patient Name: Jordan Collins Procedure Date: 11/08/2017 9:26 AM MRN: [id number] Endoscopist: Docia Chuck. Henrene Pastor , MD Age: 42 Referring MD:  Date of Birth: 1976/08/22 Gender: Male Account #: [id number] Procedure:                Upper GI endoscopy, with [facility] dilation of the                            esophagus - 78f Indications:              Dysphagia; also, hepatic steatosis with fibrosis on                            noninvasive testing. Rule out varices Medicines:                Monitored Anesthesia Care Procedure:                Pre-Anesthesia Assessment:                           - Prior to the procedure, a History and Physical                            was performed, and patient medications and                            allergies were reviewed. The patient's tolerance of                            previous anesthesia was also reviewed. The risks                            and benefits of the procedure and the sedation                            options and risks were discussed with the patient.                            All questions were answered, and informed consent                            was obtained. Prior Anticoagulants: The patient has                            taken no previous anticoagulant or antiplatelet                            agents. ASA Grade Assessment: II - A patient with                            mild systemic disease. After reviewing the risks                            and benefits, the patient was deemed in  satisfactory condition to undergo the procedure.                           After obtaining informed consent, the endoscope was                            passed under direct vision. Throughout the                            procedure, the patient's blood pressure, pulse, and                            oxygen saturations were monitored continuously. The                            Model GIF-HQ190  [id number]) scope was introduced                            through the mouth, and advanced to the second part                            of duodenum. The upper GI endoscopy was                            accomplished without difficulty. The patient                            tolerated the procedure well. Scope In: Scope Out: Findings:                 One mild benign-appearing, intrinsic stenosis was                            found 40 cm from the incisors. The scope was                            withdrawn. Dilation was performed with a Maloney                            dilator with no resistance at 71 Fr.                           The exam of the esophagus was otherwise normal. NO                            VARICES.                           The stomach was normal.                           The examined duodenum was normal.                           The cardia and gastric fundus were normal on  retroflexion. Complications:            No immediate complications. Estimated Blood Loss:     Estimated blood loss: none. Impression:               - Benign-appearing esophageal stenosis. Dilated.                           - Normal stomach.                           - Normal examined duodenum.                           - No specimens collected. Recommendation:           - Patient has a contact number available for                            emergencies. The signs and symptoms of potential                            delayed complications were discussed with the                            patient. Return to normal activities tomorrow.                            Written discharge instructions were provided to the                            patient.                           - Post dilation diet.                           - Continue present medications.                           - Routine office follow-up one year John N. Henrene Pastor, MD 11/08/2017 9:49:50 AM This report  has been signed electronically.

## 2017-11-09 ENCOUNTER — Telehealth: Payer: Self-pay

## 2017-11-09 NOTE — Telephone Encounter (Signed)
  Follow up Call-  Call back number 11/08/2017  Post procedure Call Back phone  # [phone]  Permission to leave phone message Yes  Some recent data might be hidden     Patient questions:  Do you have a fever, pain , or abdominal swelling? No. Pain Score  0 *  Have you tolerated food without any problems? Yes.    Have you been able to return to your normal activities? Yes.    Do you have any questions about your discharge instructions: Diet   No. Medications  No. Follow up visit  Yes.    Do you have questions or concerns about your Care? No.  Actions: * If pain score is 4 or above: No action needed, pain <4.

## 2017-11-09 NOTE — Telephone Encounter (Signed)
Attempted to reach patient for post-procedure f/u call. No answer. Left message that we will make another attempt to reach him again later today and for him to please not hesitate to call us if he has any questions/concerns regarding his care. 

## 2018-01-01 DIAGNOSIS — H60501 Unspecified acute noninfective otitis externa, right ear: Secondary | ICD-10-CM | POA: Diagnosis not present

## 2018-04-07 ENCOUNTER — Ambulatory Visit (INDEPENDENT_AMBULATORY_CARE_PROVIDER_SITE_OTHER): Payer: BLUE CROSS/BLUE SHIELD | Admitting: Family Medicine

## 2018-04-07 ENCOUNTER — Other Ambulatory Visit: Payer: Self-pay

## 2018-04-07 ENCOUNTER — Encounter: Payer: Self-pay | Admitting: Family Medicine

## 2018-04-07 DIAGNOSIS — N529 Male erectile dysfunction, unspecified: Secondary | ICD-10-CM | POA: Diagnosis not present

## 2018-04-07 DIAGNOSIS — K760 Fatty (change of) liver, not elsewhere classified: Secondary | ICD-10-CM

## 2018-04-07 DIAGNOSIS — K7460 Unspecified cirrhosis of liver: Secondary | ICD-10-CM | POA: Diagnosis not present

## 2018-04-07 DIAGNOSIS — R7989 Other specified abnormal findings of blood chemistry: Secondary | ICD-10-CM | POA: Diagnosis not present

## 2018-04-07 DIAGNOSIS — K2100 Gastro-esophageal reflux disease with esophagitis, without bleeding: Secondary | ICD-10-CM

## 2018-04-07 MED ORDER — TADALAFIL 10 MG PO TABS: 10.0000 mg | ORAL_TABLET | Freq: Every day | ORAL | 3 refills | Status: DC | PRN

## 2018-04-07 MED ORDER — ESOMEPRAZOLE MAGNESIUM 40 MG PO CPDR: 40.0000 mg | DELAYED_RELEASE_CAPSULE | Freq: Every day | ORAL | 3 refills | Status: DC

## 2018-04-07 NOTE — Patient Instructions (Signed)
I will call with blood test results.   See me in Sept for a physical and we can give you flu shot. Call me if you have questions/concerns about the new medication.   No nitroglycerin contain medicines when the cialis is in your system.

## 2018-04-08 ENCOUNTER — Telehealth: Payer: Self-pay

## 2018-04-08 ENCOUNTER — Encounter: Payer: Self-pay | Admitting: Family Medicine

## 2018-04-08 DIAGNOSIS — K760 Fatty (change of) liver, not elsewhere classified: Secondary | ICD-10-CM | POA: Insufficient documentation

## 2018-04-08 LAB — CMP14+EGFR
A/G RATIO: 1.6 (ref 1.2–2.2)
ALT: 65 IU/L — AB (ref 0–44)
AST: 51 IU/L — ABNORMAL HIGH (ref 0–40)
Albumin: 4.4 g/dL (ref 3.5–5.5)
Alkaline Phosphatase: 80 IU/L (ref 39–117)
BILIRUBIN TOTAL: 0.6 mg/dL (ref 0.0–1.2)
BUN/Creatinine Ratio: 14 (ref 9–20)
BUN: 11 mg/dL (ref 6–24)
CHLORIDE: 99 mmol/L (ref 96–106)
CO2: 23 mmol/L (ref 20–29)
Calcium: 9.2 mg/dL (ref 8.7–10.2)
Creatinine, Ser: 0.79 mg/dL (ref 0.76–1.27)
GFR calc non Af Amer: 112 mL/min/{1.73_m2} (ref >59)
GFR, EST AFRICAN AMERICAN: 130 mL/min/{1.73_m2} (ref >59)
GLUCOSE: 101 mg/dL — AB (ref 65–99)
Globulin, Total: 2.8 g/dL (ref 1.5–4.5)
POTASSIUM: 4.1 mmol/L (ref 3.5–5.2)
Sodium: 143 mmol/L (ref 134–144)
TOTAL PROTEIN: 7.2 g/dL (ref 6.0–8.5)

## 2018-04-08 NOTE — Assessment & Plan Note (Signed)
Recheck LFTs.  Needs wt loss.

## 2018-04-08 NOTE — Telephone Encounter (Signed)
Received fax from [facility] requesting prior authorization of Tadalafil. Clinical questions placed in PCPs box for completion.  Danley Danker, RN [facility] RN)

## 2018-04-08 NOTE — Telephone Encounter (Signed)
Form completed.

## 2018-04-08 NOTE — Assessment & Plan Note (Signed)
BMI has crossed over 35 and has concominant hepatic steatosis.  Discussed again wt loss.

## 2018-04-08 NOTE — Telephone Encounter (Signed)
Clinical questions submitted via CoverMyMeds. Status pending. Should receive faxed response within 3-5 business days.  Danley Danker, RN [facility] RN)

## 2018-04-08 NOTE — Assessment & Plan Note (Signed)
Rx with generic nexium as requested.

## 2018-04-08 NOTE — Assessment & Plan Note (Signed)
Symptoms did not improve with testosterone replacement

## 2018-04-08 NOTE — Progress Notes (Signed)
   Subjective:    Patient ID: Jordan Collins, male    DOB: 1975-12-17, 42 y.o.   MRN: [id number]  HPI  Not an annual physical - more a follow up of several problems. 1. GERD - Patient believes he gets much better relief from nexium rather than protonix.  Asks that I change his prescription. 2. 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  Tried testosterone shots x 6 months.  No benefit in ED.  Felt he had weight gain with it.  Quit due to lack of benefit. 3. ED - patient can achieve an erection but not maintain it.  No benefit from testosterone replacement.  Would like to try another approach.  No hx of CAD.  Does have a family hx.  Not on nitrates. 4. Fatty liver with elastography suggestive of cirrhosis.  Seen by GI.  Their only rec was weight loss.   Review of Systems     Objective:   Physical Exam  Abd benign        Assessment & Plan:

## 2018-04-08 NOTE — Assessment & Plan Note (Signed)
Wt loss  

## 2018-04-08 NOTE — Assessment & Plan Note (Signed)
Discussed options.  He opted for a trial of cialis.

## 2018-04-12 NOTE — Telephone Encounter (Signed)
CoverMyMeds showing this PA request as cancelled that it does not require a PA. Spoke with Bryson Ha at [employer], she does not know why a PA was generated. States it is covered under [employer] but patient portion is $300. No further action required.  Danley Danker, RN [facility] RN)

## 2018-10-31 ENCOUNTER — Encounter: Payer: Self-pay | Admitting: Family Medicine

## 2018-10-31 ENCOUNTER — Ambulatory Visit (INDEPENDENT_AMBULATORY_CARE_PROVIDER_SITE_OTHER): Payer: BLUE CROSS/BLUE SHIELD | Admitting: Family Medicine

## 2018-10-31 ENCOUNTER — Other Ambulatory Visit: Payer: Self-pay

## 2018-10-31 DIAGNOSIS — K760 Fatty (change of) liver, not elsewhere classified: Secondary | ICD-10-CM

## 2018-10-31 DIAGNOSIS — N529 Male erectile dysfunction, unspecified: Secondary | ICD-10-CM

## 2018-10-31 DIAGNOSIS — Z23 Encounter for immunization: Secondary | ICD-10-CM

## 2018-10-31 DIAGNOSIS — E781 Pure hyperglyceridemia: Secondary | ICD-10-CM

## 2018-10-31 DIAGNOSIS — Z0000 Encounter for general adult medical examination without abnormal findings: Secondary | ICD-10-CM | POA: Diagnosis not present

## 2018-10-31 DIAGNOSIS — K7460 Unspecified cirrhosis of liver: Secondary | ICD-10-CM

## 2018-10-31 LAB — POCT GLYCOSYLATED HEMOGLOBIN (HGB A1C): HEMOGLOBIN A1C: 5.6 % (ref 4.0–5.6)

## 2018-10-31 MED ORDER — TADALAFIL 10 MG PO TABS: 10.0000 mg | ORAL_TABLET | Freq: Every day | ORAL | 12 refills | Status: DC | PRN

## 2018-10-31 NOTE — Patient Instructions (Addendum)
I hope this year is less stressful than the last. If you are still coughing in Feb, call me for an x ray. Let me know if the leg pain gets worse and I will order a right knee x ray. I will call with lab results on Monday. Flu shot today.

## 2018-10-31 NOTE — Assessment & Plan Note (Signed)
Check labs and keep FU GI appointment.

## 2018-10-31 NOTE — Assessment & Plan Note (Signed)
Healthy male with obesity being the major problem.

## 2018-10-31 NOTE — Assessment & Plan Note (Signed)
BMI= 36 and related problems of steatohepatitis and hypertriglyceridemia.  Focus on diet and exercise.

## 2018-10-31 NOTE — Assessment & Plan Note (Signed)
Nice Rx response.  Refill meds.

## 2018-10-31 NOTE — Assessment & Plan Note (Signed)
Lipid panel  Also check for DM

## 2018-10-31 NOTE — Progress Notes (Signed)
Established Patient Office Visit  Subjective:  Patient ID: Jordan Collins, male    DOB: 1976/01/24  Age: 43 y.o. MRN: [id number]  CC:  Chief Complaint  Patient presents with  . Annual Exam  Patient for annual physical plus a few issues. 1. Cough x 1 month.  Began as a respiratory infection.  Now just lingering cough.  Quit smoking 2015.  Asthma as a child, no recent wheeze.  Did not get flu shot yet this year. 2. Steatohepatitis with documented cirrhosis.  Unsuccessful at weight loss thus far.  (see #3)  Has GI follow up.  Due for LFTs 3. Tough year.  Three deaths in family.  Wife does not drive due to anxiety.  One of the deaths was someone who helped with transportation.  He is emerging from tough year but did not spend much time caring for self. 4. Right leg pain seems centered around the right knee.  Does have a hx of low back pain but back does not hurt now.  No knee trauma.   5. ED.  The pills work "great" without side effects.  Wants refill.  HPDP: Major issue is obesity. Needs flus shot Needs lipid panel.  HPI Jordan Collins presents for annual physical as required by employer.    Past Medical History:  Diagnosis Date  . Asthma    as a child  . GERD (gastroesophageal reflux disease)   . OSA (obstructive sleep apnea) 09/21/2011    Past Surgical History:  Procedure Laterality Date  . IMPACTED THIRD MOLAR REMOVAL      Family History  Problem Relation Age of Onset  . Heart disease Paternal Grandfather   . Stroke Paternal Grandfather   . Liver cancer Father   . Diabetes Father   . Hypertension Father   . Hyperlipidemia Father   . Heart disease Other        heart attack    Social History   Socioeconomic History  . Marital status: Married    Spouse name: Not on file  . Number of children: Not on file  . Years of education: Not on file  . Highest education level: Not on file  Occupational History  . Occupation: Airline pilot: [employer]  Social Needs  .  Financial resource strain: Not on file  . Food insecurity:    Worry: Not on file    Inability: Not on file  . Transportation needs:    Medical: Not on file    Non-medical: Not on file  Tobacco Use  . Smoking status: Former Smoker    Packs/day: 0.50    Years: 10.00    Pack years: 5.00    Types: Cigarettes    Last attempt to quit: 2015    Years since quitting: 5.0  . Smokeless tobacco: Never Used  Substance and Sexual Activity  . Alcohol use: Yes    Alcohol/week: 3.0 standard drinks    Types: 3 Cans of beer per week  . Drug use: No  . Sexual activity: Yes    Partners: Female    Comment: with wife  Lifestyle  . Physical activity:    Days per week: Not on file    Minutes per session: Not on file  . Stress: Not on file  Relationships  . Social connections:    Talks on phone: Not on file    Gets together: Not on file    Attends religious service: Not on file  Active member of club or organization: Not on file    Attends meetings of clubs or organizations: Not on file    Relationship status: Not on file  . Intimate partner violence:    Fear of current or ex partner: Not on file    Emotionally abused: Not on file    Physically abused: Not on file    Forced sexual activity: Not on file  Other Topics Concern  . Not on file  Social History Narrative  . Not on file    Outpatient Medications Prior to Visit  Medication Sig Dispense Refill  . aspirin 81 MG tablet Take 81 mg by mouth daily.    Marland Kitchen esomeprazole (NEXIUM) 40 MG capsule Take 1 capsule (40 mg total) by mouth daily at 12 noon. 90 capsule 3  . tadalafil (CIALIS) 10 MG tablet Take 1 tablet (10 mg total) by mouth daily as needed for erectile dysfunction. 6 tablet 3   No facility-administered medications prior to visit.     No Known Allergies  ROS Review of Systems   Denies CP, SOB, wheezing, abd pain, bleeding, worrisome skin lesions, problems with strength, sensation or gait.  No change in bowel, bladder or  appetite.   Objective:    Physical Exam  BP 128/84   Pulse 82   Temp 98.2 F (36.8 C) (Oral)   Ht 5' 9"  (1.753 m)   Wt 245 lb 3.2 oz (111.2 kg)   SpO2 99%   BMI 36.21 kg/m  Wt Readings from Last 3 Encounters:  10/31/18 245 lb 3.2 oz (111.2 kg)  04/07/18 240 lb 12.8 oz (109.2 kg)  11/08/17 236 lb (107 kg)   HEENT WNL Neck supple Lungs clear Cardiac RRR without m or g Abd benign Ext no edema.  Rt knee, no effusion. Ligaments intact to provocative testing. Neuro, Motor sensory and gait grossly normal.  Health Maintenance Due  Topic Date Due  . INFLUENZA VACCINE  05/19/2018    There are no preventive care reminders to display for this patient.  Lab Results  Component Value Date   TSH 1.084 08/15/2014   Lab Results  Component Value Date   WBC 8.2 06/03/2017   HGB 15.0 06/03/2017   HCT 44.0 06/03/2017   MCV 82 06/03/2017   PLT 256 06/03/2017   Lab Results  Component Value Date   NA 143 04/07/2018   K 4.1 04/07/2018   CO2 23 04/07/2018   GLUCOSE 101 (H) 04/07/2018   BUN 11 04/07/2018   CREATININE 0.79 04/07/2018   BILITOT 0.6 04/07/2018   ALKPHOS 80 04/07/2018   AST 51 (H) 04/07/2018   ALT 65 (H) 04/07/2018   PROT 7.2 04/07/2018   ALBUMIN 4.4 04/07/2018   CALCIUM 9.2 04/07/2018   Lab Results  Component Value Date   CHOL 155 06/03/2017   Lab Results  Component Value Date   HDL 31 (L) 06/03/2017   Lab Results  Component Value Date   LDLCALC 79 06/03/2017   Lab Results  Component Value Date   TRIG 225 (H) 06/03/2017   Lab Results  Component Value Date   CHOLHDL 5.0 06/03/2017   Lab Results  Component Value Date   HGBA1C 5.6 10/31/2018      Assessment & Plan:   Problem List Items Addressed This Visit    Preventative health care   HYPERTRIGLYCERIDEMIA   Relevant Medications   tadalafil (CIALIS) 10 MG tablet   Other Relevant Orders   Lipid panel   POCT glycosylated  hemoglobin (Hb A1C) (Completed)   Hepatic steatosis   Relevant  Orders   CMP14+EGFR   TSH   Erectile dysfunction   Relevant Medications   tadalafil (CIALIS) 10 MG tablet    Other Visit Diagnoses    Need for immunization against influenza       Relevant Orders   Flu Vaccine QUAD 36+ mos IM (Completed)      Meds ordered this encounter  Medications  . tadalafil (CIALIS) 10 MG tablet    Sig: Take 1 tablet (10 mg total) by mouth daily as needed for erectile dysfunction.    Dispense:  6 tablet    Refill:  12    Follow-up: No follow-ups on file.    Zenia Resides, MD

## 2018-11-01 LAB — CMP14+EGFR
ALBUMIN: 4.2 g/dL (ref 3.5–5.5)
ALK PHOS: 92 IU/L (ref 39–117)
ALT: 57 IU/L — ABNORMAL HIGH (ref 0–44)
AST: 41 IU/L — AB (ref 0–40)
Albumin/Globulin Ratio: 1.6 (ref 1.2–2.2)
BILIRUBIN TOTAL: 0.4 mg/dL (ref 0.0–1.2)
BUN/Creatinine Ratio: 8 — ABNORMAL LOW (ref 9–20)
BUN: 7 mg/dL (ref 6–24)
CHLORIDE: 100 mmol/L (ref 96–106)
CO2: 22 mmol/L (ref 20–29)
Calcium: 9.2 mg/dL (ref 8.7–10.2)
Creatinine, Ser: 0.86 mg/dL (ref 0.76–1.27)
GFR calc Af Amer: 125 mL/min/{1.73_m2} (ref >59)
GFR calc non Af Amer: 108 mL/min/{1.73_m2} (ref >59)
GLOBULIN, TOTAL: 2.6 g/dL (ref 1.5–4.5)
GLUCOSE: 97 mg/dL (ref 65–99)
POTASSIUM: 4.1 mmol/L (ref 3.5–5.2)
Sodium: 139 mmol/L (ref 134–144)
Total Protein: 6.8 g/dL (ref 6.0–8.5)

## 2018-11-01 LAB — LIPID PANEL
Chol/HDL Ratio: 5.6 ratio — ABNORMAL HIGH (ref 0.0–5.0)
Cholesterol, Total: 158 mg/dL (ref 100–199)
HDL: 28 mg/dL — AB (ref >39)
LDL Calculated: 76 mg/dL (ref 0–99)
Triglycerides: 268 mg/dL — ABNORMAL HIGH (ref 0–149)
VLDL CHOLESTEROL CAL: 54 mg/dL — AB (ref 5–40)

## 2018-11-01 LAB — TSH: TSH: 2 u[IU]/mL (ref 0.450–4.500)

## 2018-11-28 ENCOUNTER — Encounter: Payer: Self-pay | Admitting: Internal Medicine

## 2019-03-08 DIAGNOSIS — G4733 Obstructive sleep apnea (adult) (pediatric): Secondary | ICD-10-CM | POA: Diagnosis not present

## 2019-04-08 DIAGNOSIS — G4733 Obstructive sleep apnea (adult) (pediatric): Secondary | ICD-10-CM | POA: Diagnosis not present

## 2019-04-10 ENCOUNTER — Other Ambulatory Visit: Payer: Self-pay | Admitting: Family Medicine

## 2019-04-10 DIAGNOSIS — K2100 Gastro-esophageal reflux disease with esophagitis, without bleeding: Secondary | ICD-10-CM

## 2019-05-08 DIAGNOSIS — G4733 Obstructive sleep apnea (adult) (pediatric): Secondary | ICD-10-CM | POA: Diagnosis not present

## 2019-06-08 DIAGNOSIS — G4733 Obstructive sleep apnea (adult) (pediatric): Secondary | ICD-10-CM | POA: Diagnosis not present

## 2019-07-09 ENCOUNTER — Other Ambulatory Visit: Payer: Self-pay | Admitting: Family Medicine

## 2019-07-09 DIAGNOSIS — G4733 Obstructive sleep apnea (adult) (pediatric): Secondary | ICD-10-CM | POA: Diagnosis not present

## 2019-07-09 DIAGNOSIS — K2100 Gastro-esophageal reflux disease with esophagitis, without bleeding: Secondary | ICD-10-CM

## 2019-07-23 IMAGING — US US ABDOMEN COMPLETE W/ ELASTOGRAPHY
1 series · 13 of 14 positions shown · non-contrast
Comparison: None.

CLINICAL DATA: Persistent transaminitis. Elevated LFTs. Question
cirrhosis



[Series 1: us abdomen complete w/ elastography · 0.20mm/px · 13 of 14 slices shown]
[im 1/14]
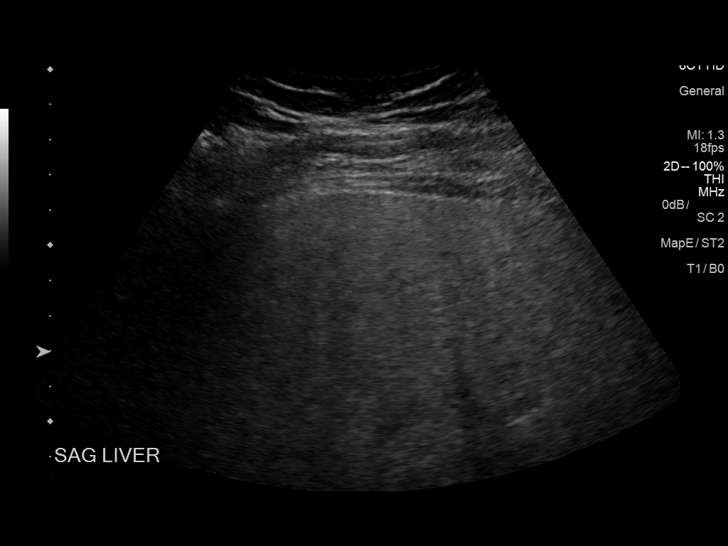
[im 2/14]
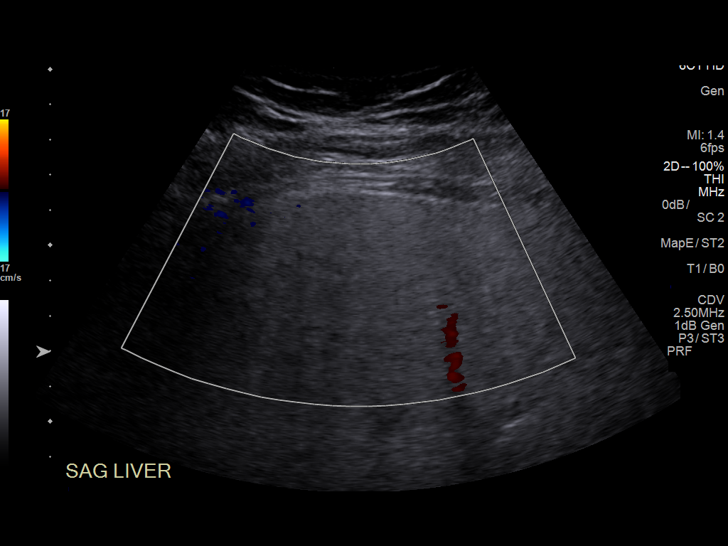
[im 3/14]
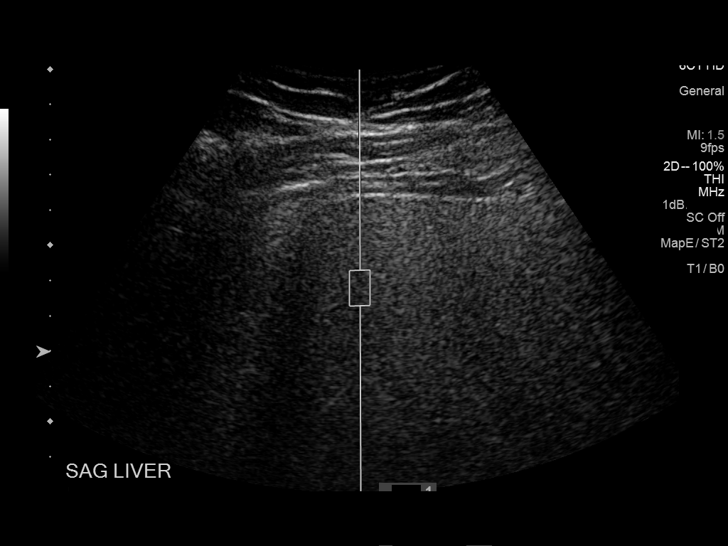
[im 4/14]
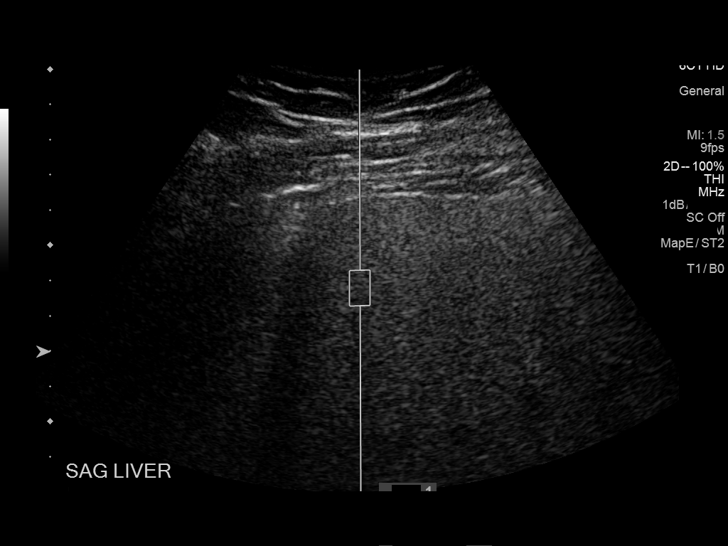
[im 5/14]
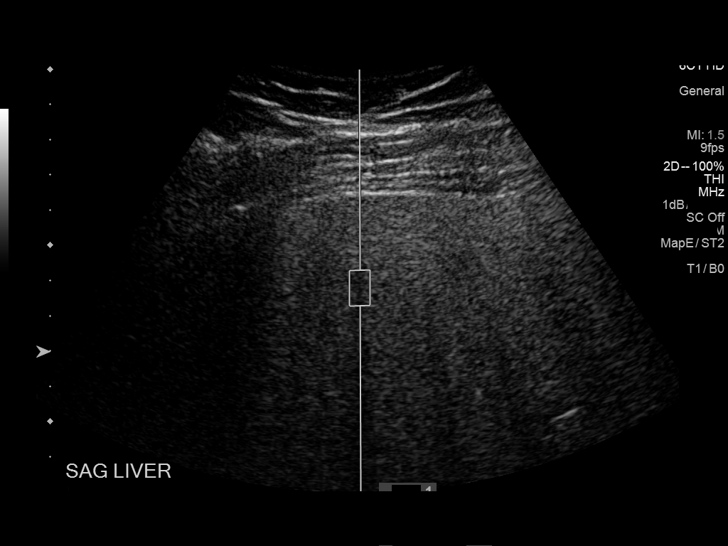
[im 6/14]
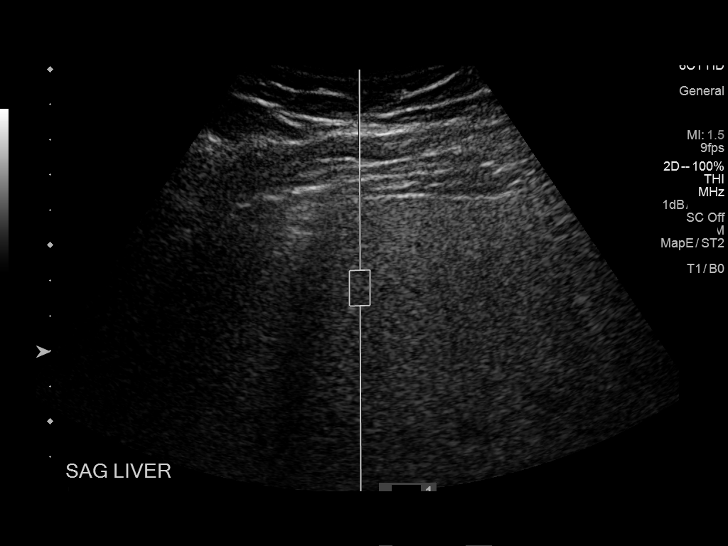
[im 8/14]
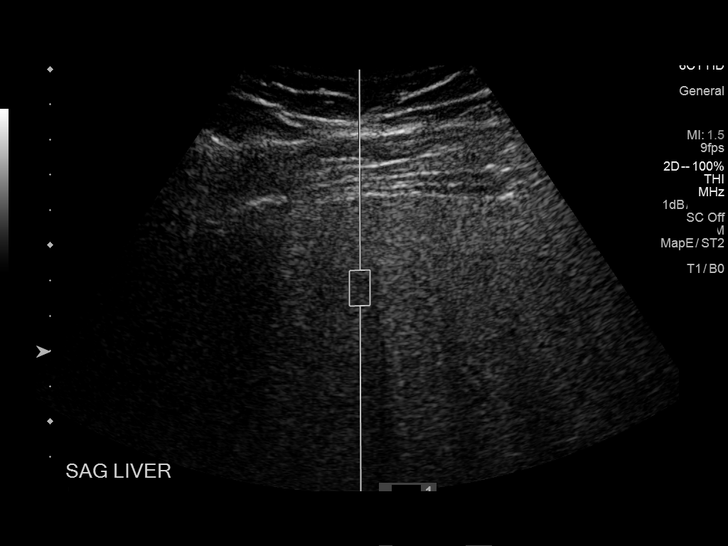
[im 9/14]
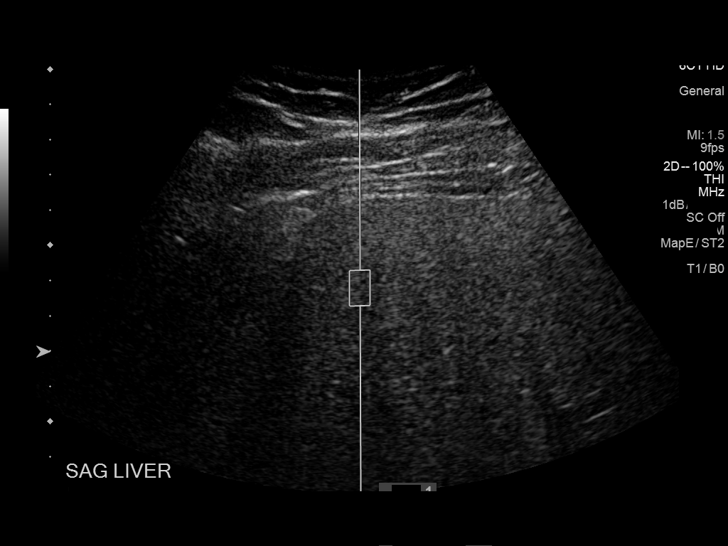
[im 10/14]
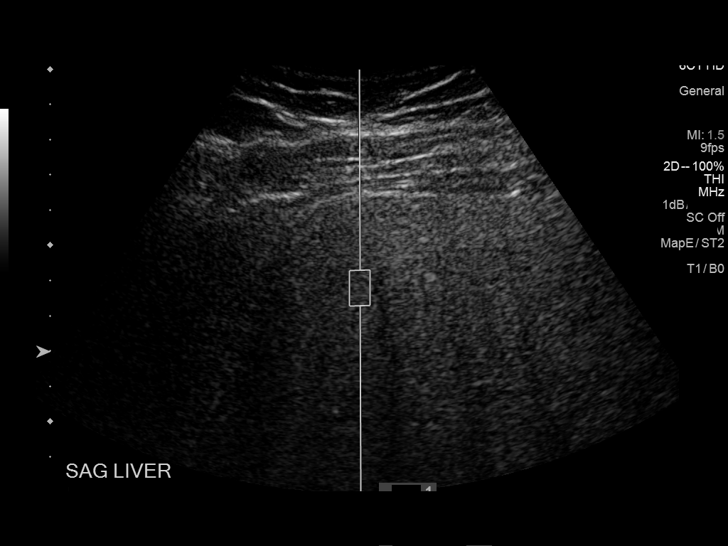
[im 11/14]
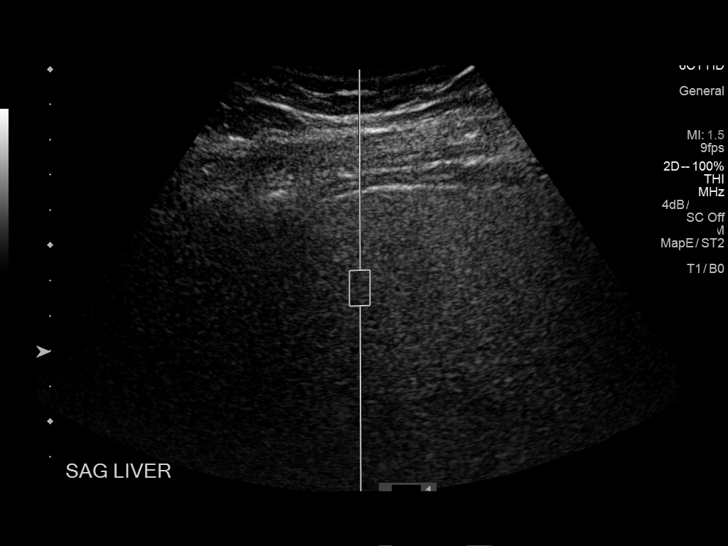
[im 12/14]
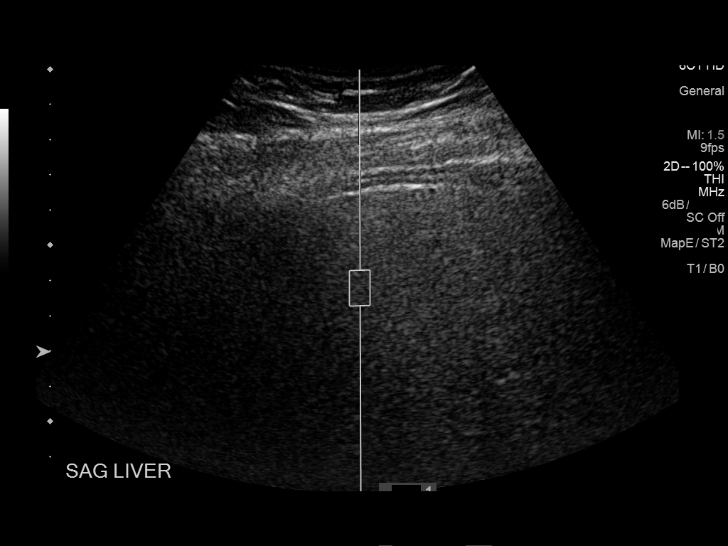
[im 13/14]
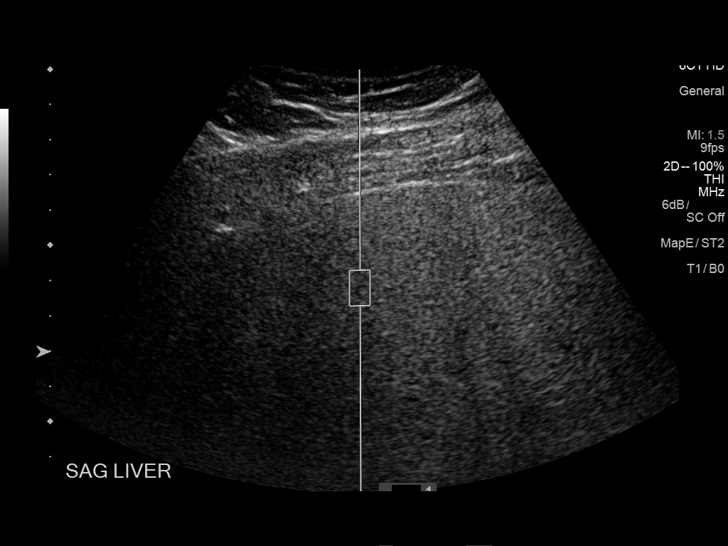
[im 14/14]
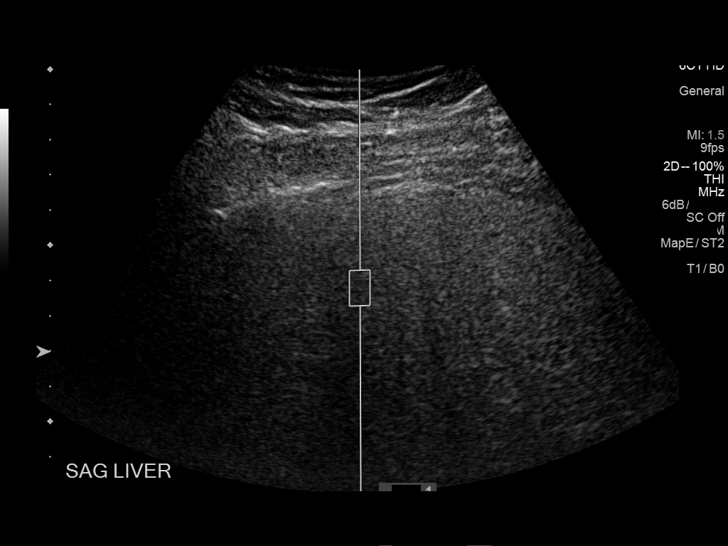

[13 of 14 positions shown; findings below may reference images not displayed]

FINDINGS: ULTRASOUND ABDOMEN

Gallbladder: No gallstones or wall thickening visualized. No
sonographic Murphy sign noted by sonographer.

Common bile duct: Diameter: Normal caliber, 1 mm.

Liver: Increased echotexture compatible with fatty infiltration or
intrinsic liver disease. No focal abnormality or biliary ductal
dilatation. Portal vein is patent on color Doppler imaging with
normal direction of blood flow towards the liver.

IVC: No abnormality visualized.

Pancreas: Visualized portion unremarkable.

Spleen: 16 cm in craniocaudal length with a splenic volume of 955
mL.

Right Kidney: Length: 12.5 cm. Echogenicity within normal limits. No
mass or hydronephrosis visualized.

Left Kidney: Length: 12.3 cm. Echogenicity within normal limits. No
mass or hydronephrosis visualized.

Abdominal aorta: No aneurysm visualized.

Other findings: None.

ULTRASOUND HEPATIC ELASTOGRAPHY

Device: Siemens Helix VTQ

Patient position: Supine

Transducer 6C1

Number of measurements: 12

Hepatic segment:  8

Median velocity:   3.34  m/sec

IQR:

IQR/Median velocity ratio:

Corresponding Metavir fibrosis score:  Some F3 + F4

Risk of fibrosis: High

Limitations of exam: None

Pertinent findings noted on other imaging exams:  None

Please note that abnormal shear wave velocities may also be
identified in clinical settings other than with hepatic fibrosis,
such as: acute hepatitis, elevated right heart and central venous
pressures including use of beta blockers, Paulus N disease
([place]), infiltrative processes such as
mastocytosis/amyloidosis/infiltrative tumor, extrahepatic
cholestasis, in the post-prandial state, and liver transplantation.
Correlation with patient history, laboratory data, and clinical
condition recommended.
IMPRESSION: ULTRASOUND ABDOMEN:
Increased echotexture throughout the liver. This could be related
tip fatty infiltration or intrinsic liver disease.

Splenomegaly.

ULTRASOUND HEPATIC ELASTOGRAPHY:

Median hepatic shear wave velocity is calculated at 3.34 m/sec.

Corresponding Metavir fibrosis score is  Some F3 + F4.

Risk of fibrosis is High.

Follow-up: Follow up advised

## 2019-08-08 DIAGNOSIS — G4733 Obstructive sleep apnea (adult) (pediatric): Secondary | ICD-10-CM | POA: Diagnosis not present

## 2019-09-08 DIAGNOSIS — G4733 Obstructive sleep apnea (adult) (pediatric): Secondary | ICD-10-CM | POA: Diagnosis not present

## 2019-10-08 DIAGNOSIS — G4733 Obstructive sleep apnea (adult) (pediatric): Secondary | ICD-10-CM | POA: Diagnosis not present

## 2019-11-04 ENCOUNTER — Other Ambulatory Visit: Payer: Self-pay | Admitting: Family Medicine

## 2019-11-04 DIAGNOSIS — N529 Male erectile dysfunction, unspecified: Secondary | ICD-10-CM

## 2019-11-08 DIAGNOSIS — G4733 Obstructive sleep apnea (adult) (pediatric): Secondary | ICD-10-CM | POA: Diagnosis not present

## 2020-03-18 DIAGNOSIS — G4733 Obstructive sleep apnea (adult) (pediatric): Secondary | ICD-10-CM | POA: Diagnosis not present

## 2020-06-05 ENCOUNTER — Ambulatory Visit
Admission: EM | Admit: 2020-06-05 | Discharge: 2020-06-05 | Disposition: A | Discharge status: Home / Self Care | Payer: BC Managed Care – PPO | Attending: Emergency Medicine | Admitting: Emergency Medicine

## 2020-06-05 DIAGNOSIS — Z1152 Encounter for screening for COVID-19: Secondary | ICD-10-CM | POA: Diagnosis not present

## 2020-06-05 NOTE — ED Triage Notes (Signed)
Pt here for covid screening no sx; exposure 5 days ago

## 2020-06-06 LAB — NOVEL CORONAVIRUS, NAA: SARS-CoV-2, NAA: NOT DETECTED

## 2020-06-06 LAB — SARS-COV-2, NAA 2 DAY TAT

## 2020-06-17 DIAGNOSIS — G4733 Obstructive sleep apnea (adult) (pediatric): Secondary | ICD-10-CM | POA: Diagnosis not present

## 2020-09-17 DIAGNOSIS — G4733 Obstructive sleep apnea (adult) (pediatric): Secondary | ICD-10-CM | POA: Diagnosis not present

## 2020-11-13 ENCOUNTER — Encounter: Payer: BC Managed Care – PPO | Admitting: Family Medicine

## 2020-11-25 ENCOUNTER — Ambulatory Visit (INDEPENDENT_AMBULATORY_CARE_PROVIDER_SITE_OTHER): Payer: BC Managed Care – PPO | Admitting: Family Medicine

## 2020-11-25 ENCOUNTER — Other Ambulatory Visit: Payer: Self-pay

## 2020-11-25 ENCOUNTER — Encounter: Payer: Self-pay | Admitting: Family Medicine

## 2020-11-25 DIAGNOSIS — K760 Fatty (change of) liver, not elsewhere classified: Secondary | ICD-10-CM

## 2020-11-25 DIAGNOSIS — K7460 Unspecified cirrhosis of liver: Secondary | ICD-10-CM | POA: Diagnosis not present

## 2020-11-25 DIAGNOSIS — G4733 Obstructive sleep apnea (adult) (pediatric): Secondary | ICD-10-CM

## 2020-11-25 DIAGNOSIS — K2100 Gastro-esophageal reflux disease with esophagitis, without bleeding: Secondary | ICD-10-CM | POA: Diagnosis not present

## 2020-11-25 DIAGNOSIS — E781 Pure hyperglyceridemia: Secondary | ICD-10-CM | POA: Diagnosis not present

## 2020-11-25 DIAGNOSIS — N529 Male erectile dysfunction, unspecified: Secondary | ICD-10-CM

## 2020-11-25 DIAGNOSIS — Z0000 Encounter for general adult medical examination without abnormal findings: Secondary | ICD-10-CM

## 2020-11-25 DIAGNOSIS — D229 Melanocytic nevi, unspecified: Secondary | ICD-10-CM | POA: Insufficient documentation

## 2020-11-25 MED ORDER — OMEPRAZOLE 40 MG PO CPDR: 40.0000 mg | DELAYED_RELEASE_CAPSULE | Freq: Every day | ORAL | 3 refills | Status: DC

## 2020-11-25 MED ORDER — TADALAFIL 10 MG PO TABS: ORAL_TABLET | ORAL | 11 refills | Status: AC

## 2020-11-25 NOTE — Assessment & Plan Note (Signed)
Check lipids 

## 2020-11-25 NOTE — Assessment & Plan Note (Signed)
Focus on long term wt loss.

## 2020-11-25 NOTE — Assessment & Plan Note (Addendum)
Long term focus on wt loss.  Also check labs.

## 2020-11-25 NOTE — Patient Instructions (Addendum)
Please make an appointment at your earliest convenience for a skin biopsy. I will call with lab test results  The biggest thing you could do for your long term health is weight loss.  The weight is affecting your liver.

## 2020-11-25 NOTE — Assessment & Plan Note (Signed)
Wt is his biggest risk factor long term. Needs shortterm biopsy of the skin lesion.

## 2020-11-25 NOTE — Assessment & Plan Note (Signed)
Controled on CPAP

## 2020-11-25 NOTE — Progress Notes (Signed)
    SUBJECTIVE:   CHIEF COMPLAINT / HPI:   Annual exam and much more: His agenda: 1. New wart on right hand present x 1 month 2. Has mole on left shoulder.  A friend expressed concerns last July.  He waited until his annual exam.  He cannot see so he is unsure if it is growning. 3. Separated from his wife one year ago.  He is now the single parent of a 44 yo son.  He initially lost weight due to stress.  He feels he is now coping well.  Denies depressive sx.  He is not exercising due to COVID and lack of time.  He is very acitve with his work. Chronic problems 1. Steatohepatosis.  Documented cirrhosis.  Wt as above. 2. Jerrye Bushy.  Would like something cheaper than nexium 3. Sleep apnea.  Using CPAP  HPDP Because of concerning mole, I did not spend time talking about cOVID or flu vaccine.  Up to date expect as above.    OBJECTIVE:   BP 132/68   Pulse 90   Ht 5\' 8"  (1.727 m)   Wt 244 lb 6.4 oz (110.9 kg)   SpO2 98%   BMI 37.16 kg/m   HEENT normal Neck supple  Lungs clear Cardiac RRR without m or g Abd benign. Ext no edema Skin.  Wart on right and 5th finger.   Most concerning mole on right shoulder has several high risk ABCD findings.  Multiple colors, 1cm in diameter.  Irregular border.  NEEDS BIOPSY  ASSESSMENT/PLAN:   OSA (obstructive sleep apnea) Controled on CPAP  Preventative health care Wt is his biggest risk factor long term. Needs shortterm biopsy of the skin lesion.  Hepatic steatosis Long term focus on wt gain.  Hepatic cirrhosis ([place]) Focus on long term wt loss.  Atypical mole High risk based on appearance.  Needs prompt biopsy.  Patient aware of concern for cancer.     Zenia Resides, MD [place]

## 2020-11-25 NOTE — Assessment & Plan Note (Signed)
High risk based on appearance.  Needs prompt biopsy.  Patient aware of concern for cancer.

## 2020-11-26 LAB — CMP14+EGFR
ALT: 40 IU/L (ref 0–44)
AST: 30 IU/L (ref 0–40)
Albumin/Globulin Ratio: 1.6 (ref 1.2–2.2)
Albumin: 4.6 g/dL (ref 4.0–5.0)
Alkaline Phosphatase: 88 IU/L (ref 44–121)
BUN/Creatinine Ratio: 12 (ref 9–20)
BUN: 10 mg/dL (ref 6–24)
Bilirubin Total: 0.4 mg/dL (ref 0.0–1.2)
CO2: 21 mmol/L (ref 20–29)
Calcium: 9.3 mg/dL (ref 8.7–10.2)
Chloride: 104 mmol/L (ref 96–106)
Creatinine, Ser: 0.81 mg/dL (ref 0.76–1.27)
GFR calc Af Amer: 126 mL/min/{1.73_m2} (ref >59)
GFR calc non Af Amer: 109 mL/min/{1.73_m2} (ref >59)
Globulin, Total: 2.9 g/dL (ref 1.5–4.5)
Glucose: 111 mg/dL — ABNORMAL HIGH (ref 65–99)
Potassium: 4.2 mmol/L (ref 3.5–5.2)
Sodium: 141 mmol/L (ref 134–144)
Total Protein: 7.5 g/dL (ref 6.0–8.5)

## 2020-11-26 LAB — LIPID PANEL
Chol/HDL Ratio: 5 ratio (ref 0.0–5.0)
Cholesterol, Total: 154 mg/dL (ref 100–199)
HDL: 31 mg/dL — ABNORMAL LOW (ref >39)
LDL Chol Calc (NIH): 94 mg/dL (ref 0–99)
Triglycerides: 165 mg/dL — ABNORMAL HIGH (ref 0–149)
VLDL Cholesterol Cal: 29 mg/dL (ref 5–40)

## 2020-11-26 LAB — TSH: TSH: 0.804 u[IU]/mL (ref 0.450–4.500)

## 2020-12-05 ENCOUNTER — Other Ambulatory Visit: Payer: Self-pay

## 2020-12-05 ENCOUNTER — Encounter: Payer: Self-pay | Admitting: Family Medicine

## 2020-12-05 ENCOUNTER — Ambulatory Visit (INDEPENDENT_AMBULATORY_CARE_PROVIDER_SITE_OTHER): Payer: BC Managed Care – PPO | Admitting: Family Medicine

## 2020-12-05 VITALS — BP 118/64 | HR 83 | Ht 68.0 in | Wt 244.8 lb

## 2020-12-05 DIAGNOSIS — D229 Melanocytic nevi, unspecified: Secondary | ICD-10-CM | POA: Diagnosis not present

## 2020-12-05 DIAGNOSIS — C4362 Malignant melanoma of left upper limb, including shoulder: Secondary | ICD-10-CM | POA: Diagnosis not present

## 2020-12-05 NOTE — Assessment & Plan Note (Signed)
Biopsy done and path sent.

## 2020-12-05 NOTE — Progress Notes (Signed)
    SUBJECTIVE:   CHIEF COMPLAINT / HPI:    For skin biopsy.  Mention mole on shoulder at annual PE last week.  Mole was concerning by ABCD rules.  Advised prompt biopsy.  Returns today for that biopsy.    OBJECTIVE:   BP 118/64   Pulse 83   Ht 5\' 8"  (1.727 m)   Wt 244 lb 12.8 oz (111 kg)   SpO2 98%   BMI 37.22 kg/m   Left shoulder mole 8 mm with  Asymetriy in morphology Borders are irregular Color is not uniform Diameter is 8 mm  Not evolving.  He cannot see so cannot be sure but thinks that it has not grown.  Informed consent obtained. Xylocaine anesthesia 4 mm punch biopsy done. No stick or cautery.  Pressure achieved hemostasis. Left clinic in good condition.  He is aware of possible dx of melanoma.  He wants phone call for path report.    ASSESSMENT/PLAN:   No problem-specific Assessment & Plan notes found for this encounter.     Zenia Resides, MD [place]

## 2020-12-05 NOTE — Patient Instructions (Signed)
I will call - likely Monday - when I have the pathology report.   I am moderately concerned that the pathology report could show cancer.  I don't want to speculate any further.  We will deal with facts once we get the biopsy results.  Call if bleeding or signs of infection. Keep dry for 24 hours.   OK to use neosporin and a bandaid.

## 2020-12-13 ENCOUNTER — Telehealth: Payer: Self-pay

## 2020-12-13 NOTE — Telephone Encounter (Signed)
Received phone call from [facility] at [facility] Pathology regarding recent biopsy results. Please see below for results:  -Dx: malignant melanoma -Breslow's Depth- 0.66mm -Clark Level: II -Peripheral margins-involved -Deep margins- Free  Fax will be coming to office once case is closed out at Pathology office.    Spoke with Dr. Gwendlyn Deutscher to see if any immediate response is needed. Dr. Gwendlyn Deutscher called patient and scheduled appointment for patient to meet with Dr. Andria Frames on Monday to discuss pathology results.   To PCP  Talbot Grumbling, RN

## 2020-12-16 ENCOUNTER — Encounter: Payer: Self-pay | Admitting: Family Medicine

## 2020-12-16 ENCOUNTER — Other Ambulatory Visit: Payer: Self-pay

## 2020-12-16 ENCOUNTER — Ambulatory Visit (INDEPENDENT_AMBULATORY_CARE_PROVIDER_SITE_OTHER): Payer: BC Managed Care – PPO | Admitting: Family Medicine

## 2020-12-16 DIAGNOSIS — C439 Malignant melanoma of skin, unspecified: Secondary | ICD-10-CM

## 2020-12-16 DIAGNOSIS — G4733 Obstructive sleep apnea (adult) (pediatric): Secondary | ICD-10-CM | POA: Diagnosis not present

## 2020-12-16 NOTE — Progress Notes (Signed)
    SUBJECTIVE:   CHIEF COMPLAINT / HPI:   FU punch biopsy of atypical mole.  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  Punch biopsy site is healing well.  Comes in for diagnosis and follow up treatment.  Also, he is now focused on health.  Note 4 lb wt loss since physical.    OBJECTIVE:   BP 112/78   Pulse 72   Ht 5\' 8"  (1.727 m)   Wt 240 lb 3.2 oz (109 kg)   SpO2 98%   BMI 36.52 kg/m    Well healed punch biopsy site.  Melanoma is 29mm in largest size (certainly less than 1 cm.)  Located on post left shoulder.  ASSESSMENT/PLAN:   Clark's level 2 superficial spreading melanoma ([place]) By my read, excisional surgery should be curative.  No need for lymph node dissection or chemo.  Will refer to gen surg for removal.  Patient aware of ongoing need for skin survelence.       Zenia Resides, MD [place]

## 2020-12-16 NOTE — Patient Instructions (Signed)
Yes, you have melanoma.  But it is the less serious type.  By my read, you should be cured by surgery.  Of course, they will look at the pathology again when they cut it out.  You should not need chemotherapy.  You should not need any lymph nodes taken out.   You owe your cousin a big thank you.   Remember to look at your skin periodically.  Remember the ABCD rule.

## 2020-12-16 NOTE — Assessment & Plan Note (Signed)
By my read, excisional surgery should be curative.  No need for lymph node dissection or chemo.  Will refer to gen surg for removal.  Patient aware of ongoing need for skin survelence.

## 2020-12-16 NOTE — Telephone Encounter (Signed)
Noted.  I am seeing Jordan Collins this morning.

## 2020-12-24 ENCOUNTER — Ambulatory Visit: Payer: Self-pay | Admitting: Surgery

## 2020-12-24 ENCOUNTER — Other Ambulatory Visit (HOSPITAL_COMMUNITY): Payer: BC Managed Care – PPO

## 2020-12-24 DIAGNOSIS — C439 Malignant melanoma of skin, unspecified: Secondary | ICD-10-CM | POA: Diagnosis not present

## 2020-12-24 NOTE — H&P (Signed)
History of Present Illness (Jordan Raimondo L. Zenia Resides MD; 12/24/2020 11:34 AM) The patient is a 45 year old male who presents with a skin neoplasm.Jordan Collins is a 45 yo male who was referred with a newly-diagnosed melanoma on the left shoulder. He says a family member first noticed it a few months ago. He saw his PCP on 2/17, who performed a 45mm punch biopsy. This showed a superficial spreading melanoma with a depth of 0.30mm. He was referred for excision. He denies any other abnormal skin lesions. He has an uncle who had skin cancer, and his father died of liver cancer.  PMH: GERD, OSA PSH: none SocHx: quit smoking 7 years ago, endorses occasional EtOH FHx: father died of liver cancer; reports an uncle had skin cancer   Past Surgical History Jordan Forehand, Collins; 12/24/2020 10:50 AM) Oral Surgery   Diagnostic Studies History Jordan Forehand, Collins; 12/24/2020 10:50 AM) Colonoscopy  never  Allergies Jordan Forehand, Collins; 12/24/2020 10:50 AM) No Known Drug Allergies  [12/24/2020]: Allergies Reconciled   Medication History Jordan Forehand, Collins; 12/24/2020 10:51 AM) Esomeprazole Magnesium (40MG  Capsule DR, Oral) Active. Omeprazole (40MG  Capsule DR, Oral) Active. Tadalafil (10MG  Tablet, Oral) Active. Medications Reconciled  Social History Jordan Forehand, Collins; 12/24/2020 10:50 AM) Alcohol use  Occasional alcohol use. Caffeine use  Tea. No drug use  Tobacco use  Former smoker.  Family History Jordan Forehand, Collins; 12/24/2020 10:50 AM) Alcohol Abuse  Brother, Family Members In General, Father, Mother. Arthritis  Father. Cancer  Father. Diabetes Mellitus  Father. Heart Disease  Family Members In General. Heart disease in male family member before age 55  Hypertension  Father.  Other Problems Jordan Forehand, Collins; 12/24/2020 10:50 AM) Asthma  Back Pain  Gastroesophageal Reflux Disease  Sleep Apnea     Review of Systems Jordan Collins; 12/24/2020 10:50 AM) General Not  Present- Appetite Loss, Chills, Fatigue, Fever, Night Sweats, Weight Gain and Weight Loss. Skin Present- Change in Wart/Mole. Not Present- Dryness, Hives, Jaundice, New Lesions, Non-Healing Wounds, Rash and Ulcer. HEENT Present- Seasonal Allergies and Wears glasses/contact lenses. Not Present- Earache, Hearing Loss, Hoarseness, Nose Bleed, Oral Ulcers, Ringing in the Ears, Sinus Pain, Sore Throat, Visual Disturbances and Yellow Eyes. Respiratory Present- Snoring. Not Present- Bloody sputum, Chronic Cough, Difficulty Breathing and Wheezing. Breast Not Present- Breast Mass, Breast Pain, Nipple Discharge and Skin Changes. Cardiovascular Not Present- Chest Pain, Difficulty Breathing Lying Down, Leg Cramps, Palpitations, Rapid Heart Rate, Shortness of Breath and Swelling of Extremities. Gastrointestinal Not Present- Abdominal Pain, Bloating, Bloody Stool, Change in Bowel Habits, Chronic diarrhea, Constipation, Difficulty Swallowing, Excessive gas, Gets full quickly at meals, Hemorrhoids, Indigestion, Nausea, Rectal Pain and Vomiting. Male Genitourinary Present- Impotence. Not Present- Blood in Urine, Change in Urinary Stream, Frequency, Nocturia, Painful Urination, Urgency and Urine Leakage. Musculoskeletal Not Present- Back Pain, Joint Pain, Joint Stiffness, Muscle Pain, Muscle Weakness and Swelling of Extremities. Neurological Not Present- Decreased Memory, Fainting, Headaches, Numbness, Seizures, Tingling, Tremor, Trouble walking and Weakness. Psychiatric Not Present- Anxiety, Bipolar, Change in Sleep Pattern, Depression, Fearful and Frequent crying. Endocrine Not Present- Cold Intolerance, Excessive Hunger, Hair Changes, Heat Intolerance, Hot flashes and New Diabetes. Hematology Not Present- Blood Thinners, Easy Bruising, Excessive bleeding, Gland problems, HIV and Persistent Infections.  Vitals (Jordan Alston Collins; 12/24/2020 10:51 AM) 12/24/2020 10:51 AM Weight: 246.38 lb Height: 68in Body Surface  Area: 2.23 m Body Mass Index: 37.46 kg/m  Temp.: 98.80F  Pulse: 88 (Regular)  P.OX: 98% (Room air) BP: 110/70(Sitting, Left Arm, Standard)  Physical Exam (Jordan Brar L. Zenia Resides MD; 12/24/2020 11:35 AM) The physical exam findings are as follows: Note: Constitutional: No acute distress; conversant; no deformities Neuro: alert and oriented; cranial nerves grossly in tact; no focal deficits Eyes: Moist conjunctiva; anicteric sclerae; extraocular movements in tact Neck: Trachea midline Lungs: Normal respiratory effort; lungs clear to auscultation bilaterally; symmetric chest wall expansion CV: Regular rate and rhythm; no murmurs; no pitting edema GI: Abdomen soft, nontender, nondistended; no masses or organomegaly MSK: Normal gait and station; no clubbing/cyanosis Psychiatric: Appropriate affect; alert and oriented 3 Skin: pigmented lesion on posterior left shoulder, approximately 0.5cm in diameter, well-healed biopsy scar. No other concerning lesions on the back, chest, arms, or legs. Lymphatic: No palpable bilateral cervical or axillary lymphadenopathy    Assessment & Plan ([place] L. Zenia Resides MD; 12/24/2020 11:37 AM) MELANOMA (C43.9) Story: 45 yo male presenting with a pT1a malignant melanoma of the left shoulder. I recommended wide local excision of the primary lesion with 1cm margins. Given the depth of <0.83mm without ulceration, sentinel lymph node biopsy is not indicated. Risks and benefits of surgery were discussed, including the postoperative lifting and ROM restrictions of the left arm. 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 Planning to schedule him for outpatient surgery this Friday, March 11.  Michaelle Birks, MD [facility] Surgery General, Hepatobiliary and Pancreatic Surgery 12/24/20 11:45 AM

## 2020-12-24 NOTE — H&P (View-Only) (Signed)
History of Present Illness (Rosita Guzzetta L. Zenia Resides MD; 12/24/2020 11:34 AM) The patient is a 45 year old male who presents with a skin neoplasm.Jordan Collins is a 45 yo male who was referred with a newly-diagnosed melanoma on the left shoulder. He says a family member first noticed it a few months ago. He saw his PCP on 2/17, who performed a 76mm punch biopsy. This showed a superficial spreading melanoma with a depth of 0.62mm. He was referred for excision. He denies any other abnormal skin lesions. He has an uncle who had skin cancer, and his father died of liver cancer.  PMH: GERD, OSA PSH: none SocHx: quit smoking 7 years ago, endorses occasional EtOH FHx: father died of liver cancer; reports an uncle had skin cancer   Past Surgical History Janeann Forehand, CNA; 12/24/2020 10:50 AM) Oral Surgery   Diagnostic Studies History Janeann Forehand, CNA; 12/24/2020 10:50 AM) Colonoscopy  never  Allergies Janeann Forehand, CNA; 12/24/2020 10:50 AM) No Known Drug Allergies  [12/24/2020]: Allergies Reconciled   Medication History Janeann Forehand, CNA; 12/24/2020 10:51 AM) Esomeprazole Magnesium (40MG  Capsule DR, Oral) Active. Omeprazole (40MG  Capsule DR, Oral) Active. Tadalafil (10MG  Tablet, Oral) Active. Medications Reconciled  Social History Janeann Forehand, CNA; 12/24/2020 10:50 AM) Alcohol use  Occasional alcohol use. Caffeine use  Tea. No drug use  Tobacco use  Former smoker.  Family History Janeann Forehand, CNA; 12/24/2020 10:50 AM) Alcohol Abuse  Brother, Family Members In General, Father, Mother. Arthritis  Father. Cancer  Father. Diabetes Mellitus  Father. Heart Disease  Family Members In General. Heart disease in male family member before age 22  Hypertension  Father.  Other Problems Janeann Forehand, CNA; 12/24/2020 10:50 AM) Asthma  Back Pain  Gastroesophageal Reflux Disease  Sleep Apnea     Review of Systems [facility] Edgar CNA; 12/24/2020 10:50 AM) General Not  Present- Appetite Loss, Chills, Fatigue, Fever, Night Sweats, Weight Gain and Weight Loss. Skin Present- Change in Wart/Mole. Not Present- Dryness, Hives, Jaundice, New Lesions, Non-Healing Wounds, Rash and Ulcer. HEENT Present- Seasonal Allergies and Wears glasses/contact lenses. Not Present- Earache, Hearing Loss, Hoarseness, Nose Bleed, Oral Ulcers, Ringing in the Ears, Sinus Pain, Sore Throat, Visual Disturbances and Yellow Eyes. Respiratory Present- Snoring. Not Present- Bloody sputum, Chronic Cough, Difficulty Breathing and Wheezing. Breast Not Present- Breast Mass, Breast Pain, Nipple Discharge and Skin Changes. Cardiovascular Not Present- Chest Pain, Difficulty Breathing Lying Down, Leg Cramps, Palpitations, Rapid Heart Rate, Shortness of Breath and Swelling of Extremities. Gastrointestinal Not Present- Abdominal Pain, Bloating, Bloody Stool, Change in Bowel Habits, Chronic diarrhea, Constipation, Difficulty Swallowing, Excessive gas, Gets full quickly at meals, Hemorrhoids, Indigestion, Nausea, Rectal Pain and Vomiting. Male Genitourinary Present- Impotence. Not Present- Blood in Urine, Change in Urinary Stream, Frequency, Nocturia, Painful Urination, Urgency and Urine Leakage. Musculoskeletal Not Present- Back Pain, Joint Pain, Joint Stiffness, Muscle Pain, Muscle Weakness and Swelling of Extremities. Neurological Not Present- Decreased Memory, Fainting, Headaches, Numbness, Seizures, Tingling, Tremor, Trouble walking and Weakness. Psychiatric Not Present- Anxiety, Bipolar, Change in Sleep Pattern, Depression, Fearful and Frequent crying. Endocrine Not Present- Cold Intolerance, Excessive Hunger, Hair Changes, Heat Intolerance, Hot flashes and New Diabetes. Hematology Not Present- Blood Thinners, Easy Bruising, Excessive bleeding, Gland problems, HIV and Persistent Infections.  Vitals (Donyelle Alston CNA; 12/24/2020 10:51 AM) 12/24/2020 10:51 AM Weight: 246.38 lb Height: 68in Body Surface  Area: 2.23 m Body Mass Index: 37.46 kg/m  Temp.: 98.13F  Pulse: 88 (Regular)  P.OX: 98% (Room air) BP: 110/70(Sitting, Left Arm, Standard)  Physical Exam (Marvelous Bouwens L. Zenia Resides MD; 12/24/2020 11:35 AM) The physical exam findings are as follows: Note: Constitutional: No acute distress; conversant; no deformities Neuro: alert and oriented; cranial nerves grossly in tact; no focal deficits Eyes: Moist conjunctiva; anicteric sclerae; extraocular movements in tact Neck: Trachea midline Lungs: Normal respiratory effort; lungs clear to auscultation bilaterally; symmetric chest wall expansion CV: Regular rate and rhythm; no murmurs; no pitting edema GI: Abdomen soft, nontender, nondistended; no masses or organomegaly MSK: Normal gait and station; no clubbing/cyanosis Psychiatric: Appropriate affect; alert and oriented 3 Skin: pigmented lesion on posterior left shoulder, approximately 0.5cm in diameter, well-healed biopsy scar. No other concerning lesions on the back, chest, arms, or legs. Lymphatic: No palpable bilateral cervical or axillary lymphadenopathy    Assessment & Plan ([place] L. Zenia Resides MD; 12/24/2020 11:37 AM) MELANOMA (C43.9) Story: 45 yo male presenting with a pT1a malignant melanoma of the left shoulder. I recommended wide local excision of the primary lesion with 1cm margins. Given the depth of <0.55mm without ulceration, sentinel lymph node biopsy is not indicated. Risks and benefits of surgery were discussed, including the postoperative lifting and ROM restrictions of the left arm. 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 Planning to schedule him for outpatient surgery this Friday, March 11.  Michaelle Birks, MD [facility] Surgery General, Hepatobiliary and Pancreatic Surgery 12/24/20 11:45 AM

## 2020-12-27 ENCOUNTER — Encounter (HOSPITAL_BASED_OUTPATIENT_CLINIC_OR_DEPARTMENT_OTHER): Payer: Self-pay | Admitting: Surgery

## 2020-12-27 DIAGNOSIS — C801 Malignant (primary) neoplasm, unspecified: Secondary | ICD-10-CM

## 2020-12-27 HISTORY — DX: Malignant (primary) neoplasm, unspecified: C80.1

## 2020-12-30 ENCOUNTER — Encounter (HOSPITAL_BASED_OUTPATIENT_CLINIC_OR_DEPARTMENT_OTHER): Payer: Self-pay | Admitting: Surgery

## 2020-12-30 ENCOUNTER — Other Ambulatory Visit: Payer: Self-pay

## 2020-12-31 ENCOUNTER — Other Ambulatory Visit (HOSPITAL_COMMUNITY)
Admission: RE | Admit: 2020-12-31 | Discharge: 2020-12-31 | Disposition: A | Discharge status: Home / Self Care | Payer: BC Managed Care – PPO | Source: Ambulatory Visit | Attending: Surgery | Admitting: Surgery

## 2020-12-31 DIAGNOSIS — G4733 Obstructive sleep apnea (adult) (pediatric): Secondary | ICD-10-CM | POA: Diagnosis not present

## 2020-12-31 DIAGNOSIS — Z809 Family history of malignant neoplasm, unspecified: Secondary | ICD-10-CM | POA: Diagnosis not present

## 2020-12-31 DIAGNOSIS — Z20822 Contact with and (suspected) exposure to covid-19: Secondary | ICD-10-CM | POA: Insufficient documentation

## 2020-12-31 DIAGNOSIS — Z01812 Encounter for preprocedural laboratory examination: Secondary | ICD-10-CM | POA: Insufficient documentation

## 2020-12-31 DIAGNOSIS — Z87891 Personal history of nicotine dependence: Secondary | ICD-10-CM | POA: Diagnosis not present

## 2020-12-31 DIAGNOSIS — K219 Gastro-esophageal reflux disease without esophagitis: Secondary | ICD-10-CM | POA: Diagnosis not present

## 2020-12-31 DIAGNOSIS — C4362 Malignant melanoma of left upper limb, including shoulder: Secondary | ICD-10-CM | POA: Diagnosis not present

## 2020-12-31 LAB — SARS CORONAVIRUS 2 (TAT 6-24 HRS): SARS Coronavirus 2: NEGATIVE

## 2021-01-01 ENCOUNTER — Encounter (HOSPITAL_BASED_OUTPATIENT_CLINIC_OR_DEPARTMENT_OTHER)
Admission: RE | Admit: 2021-01-01 | Discharge: 2021-01-01 | Disposition: A | Discharge status: Home / Self Care | Payer: BC Managed Care – PPO | Source: Ambulatory Visit | Attending: Surgery | Admitting: Surgery

## 2021-01-01 DIAGNOSIS — Z809 Family history of malignant neoplasm, unspecified: Secondary | ICD-10-CM | POA: Diagnosis not present

## 2021-01-01 DIAGNOSIS — G4733 Obstructive sleep apnea (adult) (pediatric): Secondary | ICD-10-CM | POA: Diagnosis not present

## 2021-01-01 DIAGNOSIS — C4362 Malignant melanoma of left upper limb, including shoulder: Secondary | ICD-10-CM | POA: Diagnosis not present

## 2021-01-01 DIAGNOSIS — Z20822 Contact with and (suspected) exposure to covid-19: Secondary | ICD-10-CM | POA: Diagnosis not present

## 2021-01-01 DIAGNOSIS — K219 Gastro-esophageal reflux disease without esophagitis: Secondary | ICD-10-CM | POA: Diagnosis not present

## 2021-01-01 DIAGNOSIS — Z87891 Personal history of nicotine dependence: Secondary | ICD-10-CM | POA: Diagnosis not present

## 2021-01-01 LAB — COMPREHENSIVE METABOLIC PANEL
ALT: 29 U/L (ref 0–44)
AST: 28 U/L (ref 15–41)
Albumin: 4.1 g/dL (ref 3.5–5.0)
Alkaline Phosphatase: 66 U/L (ref 38–126)
Anion gap: 8 (ref 5–15)
BUN: 9 mg/dL (ref 6–20)
CO2: 25 mmol/L (ref 22–32)
Calcium: 9.6 mg/dL (ref 8.9–10.3)
Chloride: 104 mmol/L (ref 98–111)
Creatinine, Ser: 0.92 mg/dL (ref 0.61–1.24)
GFR, Estimated: >60 mL/min (ref >60)
Glucose, Bld: 95 mg/dL (ref 70–99)
Potassium: 4.4 mmol/L (ref 3.5–5.1)
Sodium: 137 mmol/L (ref 135–145)
Total Bilirubin: 1.1 mg/dL (ref 0.3–1.2)
Total Protein: 7.4 g/dL (ref 6.5–8.1)

## 2021-01-03 ENCOUNTER — Ambulatory Visit (HOSPITAL_BASED_OUTPATIENT_CLINIC_OR_DEPARTMENT_OTHER): Payer: BC Managed Care – PPO | Admitting: Certified Registered"

## 2021-01-03 ENCOUNTER — Other Ambulatory Visit: Payer: Self-pay

## 2021-01-03 ENCOUNTER — Ambulatory Visit (HOSPITAL_COMMUNITY)
Admission: RE | Admit: 2021-01-03 | Discharge: 2021-01-03 | Disposition: A | Discharge status: Home / Self Care | Payer: BC Managed Care – PPO | Attending: Surgery | Admitting: Surgery

## 2021-01-03 ENCOUNTER — Encounter (HOSPITAL_BASED_OUTPATIENT_CLINIC_OR_DEPARTMENT_OTHER): Payer: Self-pay | Admitting: Surgery

## 2021-01-03 ENCOUNTER — Encounter (HOSPITAL_BASED_OUTPATIENT_CLINIC_OR_DEPARTMENT_OTHER)
Admission: RE | Disposition: A | Discharge status: Home / Self Care | Payer: Self-pay | Source: Home / Self Care | Attending: Surgery

## 2021-01-03 DIAGNOSIS — C44699 Other specified malignant neoplasm of skin of left upper limb, including shoulder: Secondary | ICD-10-CM | POA: Diagnosis not present

## 2021-01-03 DIAGNOSIS — K219 Gastro-esophageal reflux disease without esophagitis: Secondary | ICD-10-CM | POA: Insufficient documentation

## 2021-01-03 DIAGNOSIS — C4362 Malignant melanoma of left upper limb, including shoulder: Secondary | ICD-10-CM | POA: Diagnosis not present

## 2021-01-03 DIAGNOSIS — Z87891 Personal history of nicotine dependence: Secondary | ICD-10-CM | POA: Diagnosis not present

## 2021-01-03 DIAGNOSIS — Z809 Family history of malignant neoplasm, unspecified: Secondary | ICD-10-CM | POA: Insufficient documentation

## 2021-01-03 DIAGNOSIS — Z20822 Contact with and (suspected) exposure to covid-19: Secondary | ICD-10-CM | POA: Insufficient documentation

## 2021-01-03 DIAGNOSIS — G4733 Obstructive sleep apnea (adult) (pediatric): Secondary | ICD-10-CM | POA: Diagnosis not present

## 2021-01-03 DIAGNOSIS — J45909 Unspecified asthma, uncomplicated: Secondary | ICD-10-CM | POA: Diagnosis not present

## 2021-01-03 HISTORY — PX: MELANOMA EXCISION: SHX5266

## 2021-01-03 HISTORY — DX: Unspecified cirrhosis of liver: K74.60

## 2021-01-03 HISTORY — DX: Compression of vein: I87.1

## 2021-01-03 SURGERY — EXCISION, MELANOMA
Anesthesia: General | Site: Shoulder | Laterality: Left

## 2021-01-03 MED ORDER — MIDAZOLAM HCL 2 MG/2ML IJ SOLN
INTRAMUSCULAR | Status: AC
  Filled 2021-01-03: qty 2

## 2021-01-03 MED ORDER — ACETAMINOPHEN 10 MG/ML IV SOLN
1000.0000 mg | Freq: Once | INTRAVENOUS | Status: DC | PRN
Start: 2021-01-03 — End: 2021-01-03

## 2021-01-03 MED ORDER — CEFAZOLIN SODIUM-DEXTROSE 2-4 GM/100ML-% IV SOLN
INTRAVENOUS | Status: AC
  Filled 2021-01-03: qty 100

## 2021-01-03 MED ORDER — CEFAZOLIN SODIUM-DEXTROSE 2-4 GM/100ML-% IV SOLN
2.0000 g | INTRAVENOUS | Status: AC
  Administered 2021-01-03: 2 g via INTRAVENOUS

## 2021-01-03 MED ORDER — FENTANYL CITRATE (PF) 100 MCG/2ML IJ SOLN
25.0000 ug | INTRAMUSCULAR | Status: DC | PRN
Start: 2021-01-03 — End: 2021-01-03

## 2021-01-03 MED ORDER — MIDAZOLAM HCL 5 MG/5ML IJ SOLN
INTRAMUSCULAR | Status: DC | PRN
  Administered 2021-01-03: 2 mg via INTRAVENOUS

## 2021-01-03 MED ORDER — PROPOFOL 10 MG/ML IV BOLUS
INTRAVENOUS | Status: AC
  Filled 2021-01-03: qty 40

## 2021-01-03 MED ORDER — OXYCODONE HCL 5 MG PO TABS: 5.0000 mg | ORAL_TABLET | Freq: Once | ORAL | Status: DC | PRN

## 2021-01-03 MED ORDER — PROPOFOL 10 MG/ML IV BOLUS
INTRAVENOUS | Status: DC | PRN
  Administered 2021-01-03: 150 mg via INTRAVENOUS

## 2021-01-03 MED ORDER — PROMETHAZINE HCL 25 MG/ML IJ SOLN: 6.2500 mg | INTRAMUSCULAR | Status: DC | PRN

## 2021-01-03 MED ORDER — ONDANSETRON HCL 4 MG/2ML IJ SOLN
INTRAMUSCULAR | Status: DC | PRN
  Administered 2021-01-03: 4 mg via INTRAVENOUS

## 2021-01-03 MED ORDER — LACTATED RINGERS IV SOLN: INTRAVENOUS | Status: DC

## 2021-01-03 MED ORDER — DEXAMETHASONE SODIUM PHOSPHATE 10 MG/ML IJ SOLN
INTRAMUSCULAR | Status: AC
  Filled 2021-01-03: qty 1

## 2021-01-03 MED ORDER — FENTANYL CITRATE (PF) 100 MCG/2ML IJ SOLN
INTRAMUSCULAR | Status: AC
  Filled 2021-01-03: qty 2

## 2021-01-03 MED ORDER — FENTANYL CITRATE (PF) 100 MCG/2ML IJ SOLN
INTRAMUSCULAR | Status: DC | PRN
  Administered 2021-01-03: 100 ug via INTRAVENOUS
  Administered 2021-01-03: 50 ug via INTRAVENOUS

## 2021-01-03 MED ORDER — OXYCODONE HCL 5 MG PO TABS: 5.0000 mg | ORAL_TABLET | Freq: Four times a day (QID) | ORAL | 0 refills | Status: DC | PRN

## 2021-01-03 MED ORDER — BUPIVACAINE-EPINEPHRINE (PF) 0.25% -1:200000 IJ SOLN
INTRAMUSCULAR | Status: DC | PRN
  Administered 2021-01-03: 20 mL

## 2021-01-03 MED ORDER — ONDANSETRON HCL 4 MG/2ML IJ SOLN
INTRAMUSCULAR | Status: AC
  Filled 2021-01-03: qty 2

## 2021-01-03 MED ORDER — LIDOCAINE 2% (20 MG/ML) 5 ML SYRINGE
INTRAMUSCULAR | Status: AC
  Filled 2021-01-03: qty 5

## 2021-01-03 MED ORDER — LIDOCAINE 2% (20 MG/ML) 5 ML SYRINGE
INTRAMUSCULAR | Status: DC | PRN
  Administered 2021-01-03: 80 mg via INTRAVENOUS

## 2021-01-03 MED ORDER — DEXAMETHASONE SODIUM PHOSPHATE 4 MG/ML IJ SOLN
INTRAMUSCULAR | Status: DC | PRN
  Administered 2021-01-03: 10 mg via INTRAVENOUS

## 2021-01-03 MED ORDER — OXYCODONE HCL 5 MG/5ML PO SOLN: 5.0000 mg | Freq: Once | ORAL | Status: DC | PRN

## 2021-01-03 SURGICAL SUPPLY — 28 items
BLADE SURG 15 STRL LF DISP TIS (BLADE) ×1 IMPLANT
BLADE SURG 15 STRL SS (BLADE) ×2
CHLORAPREP W/TINT 26 (MISCELLANEOUS) ×2 IMPLANT
COVER BACK TABLE 60X90IN (DRAPES) ×2 IMPLANT
COVER MAYO STAND STRL (DRAPES) ×2 IMPLANT
DERMABOND ADVANCED (GAUZE/BANDAGES/DRESSINGS) ×1
DERMABOND ADVANCED .7 DNX12 (GAUZE/BANDAGES/DRESSINGS) ×1 IMPLANT
DRAPE LAPAROTOMY 100X72 PEDS (DRAPES) ×2 IMPLANT
DRAPE UTILITY XL STRL (DRAPES) ×2 IMPLANT
DRSG MEPILEX BORDER 4X8 (GAUZE/BANDAGES/DRESSINGS) ×2 IMPLANT
ELECT REM PT RETURN 9FT ADLT (ELECTROSURGICAL) ×2
ELECTRODE REM PT RTRN 9FT ADLT (ELECTROSURGICAL) ×1 IMPLANT
GLOVE SURG POLY MICRO LF SZ5.5 (GLOVE) ×2 IMPLANT
GLOVE SURG UNDER POLY LF SZ6 (GLOVE) ×2 IMPLANT
GOWN STRL REUS W/ TWL LRG LVL3 (GOWN DISPOSABLE) ×2 IMPLANT
GOWN STRL REUS W/TWL LRG LVL3 (GOWN DISPOSABLE) ×4
NEEDLE HYPO 25X1 1.5 SAFETY (NEEDLE) ×2 IMPLANT
PACK BASIN DAY SURGERY FS (CUSTOM PROCEDURE TRAY) ×2 IMPLANT
PENCIL SMOKE EVACUATOR (MISCELLANEOUS) ×2 IMPLANT
SLEEVE SCD COMPRESS KNEE MED (STOCKING) ×2 IMPLANT
SLING ARM FOAM STRAP XLG (SOFTGOODS) ×2 IMPLANT
SPONGE LAP 4X18 RFD (DISPOSABLE) ×2 IMPLANT
SUT ETHILON 3 0 PS 1 (SUTURE) ×4 IMPLANT
SUT MNCRL AB 4-0 PS2 18 (SUTURE) ×2 IMPLANT
SUT SILK 2 0 SH (SUTURE) ×2 IMPLANT
SUT VICRYL 3-0 CR8 SH (SUTURE) ×2 IMPLANT
SYR CONTROL 10ML LL (SYRINGE) ×2 IMPLANT
TOWEL GREEN STERILE FF (TOWEL DISPOSABLE) ×2 IMPLANT

## 2021-01-03 NOTE — Interval H&P Note (Signed)
History and Physical Interval Note:  01/03/2021 8:43 AM  Jordan Collins  has presented today for surgery, with the diagnosis of MELANOMA LEFT SHOULDER.  The various methods of treatment have been discussed with the patient and family. After consideration of risks, benefits and other options for treatment, the patient has consented to  Procedure(s) with comments: [place] (N/A) - LMA  60 MIN as a surgical intervention.  The patient's history has been reviewed, patient examined, no change in status, stable for surgery.  I have reviewed the patient's chart and labs.  Questions were answered to the patient's satisfaction. Surgical site confirmed and marked. Plan for discharge home after surgery.   Dwan Bolt

## 2021-01-03 NOTE — Discharge Instructions (Signed)
[facility] DISCHARGE INSTRUCTIONS  Activity . Minimize heavy lifting with your left arm for at least 2 weeks after surgery. Marland Kitchen Ok to shower in 24 hours, but do not bathe or submerge incision underwater. . Do not drive while taking narcotic pain medication.  Wound Care . Your incision is covered with skin glue called Dermabond. This will peel off on its own over time. . You may remove the dressing over your incision and shower in 24 hours. Allow warm soapy water to run over your incisions. Gently pat dry. . Do not submerge your incision underwater. . Monitor your incision for any new redness, tenderness, or drainage.  When to Call us: Marland Kitchen Fever greater than 100.5 . New redness, drainage, or swelling at incision site . Severe pain, nausea, or vomiting  . Jaundice (yellowing of the whites of the eyes or skin)  Follow-up You will have a follow up appointment on January 22, 2021 at 11:30am. Your sutures will be removed at this visit. This will be at the [facility] Surgery office at [address], [place], [place], [place]. Please arrive at least 15 minutes prior to your scheduled appointment time.  For questions or concerns, please call the office at [phone].  Post Anesthesia Home Care Instructions  Activity: Get plenty of rest for the remainder of the day. A responsible individual must stay with you for 24 hours following the procedure.  For the next 24 hours, DO NOT: -Drive a car -Paediatric nurse -Drink alcoholic beverages -Take any medication unless instructed by your physician -Make any legal decisions or sign important papers.  Meals: Start with liquid foods such as gelatin or soup. Progress to regular foods as tolerated. Avoid greasy, spicy, heavy foods. If nausea and/or vomiting occur, drink only clear liquids until the nausea and/or vomiting subsides. Call your physician if vomiting continues.  Special Instructions/Symptoms: Your throat may feel  dry or sore from the anesthesia or the breathing tube placed in your throat during surgery. If this causes discomfort, gargle with warm salt water. The discomfort should disappear within 24 hours.  If you had a scopolamine patch placed behind your ear for the management of post- operative nausea and/or vomiting:  1. The medication in the patch is effective for 72 hours, after which it should be removed.  Wrap patch in a tissue and discard in the trash. Wash hands thoroughly with soap and water. 2. You may remove the patch earlier than 72 hours if you experience unpleasant side effects which may include dry mouth, dizziness or visual disturbances. 3. Avoid touching the patch. Wash your hands with soap and water after contact with the patch.

## 2021-01-03 NOTE — Anesthesia Preprocedure Evaluation (Addendum)
Anesthesia Evaluation  Patient identified by MRN, date of birth, ID band Patient awake    Reviewed: Allergy & Precautions, NPO status , Patient's Chart, lab work & pertinent test results  Airway Mallampati: II  TM Distance: >3 FB Neck ROM: Full    Dental  (+) Chipped,    Pulmonary asthma , sleep apnea and Continuous Positive Airway Pressure Ventilation , former smoker,    Pulmonary exam normal        Cardiovascular negative cardio ROS   Rhythm:Regular Rate:Normal     Neuro/Psych negative neurological ROS  negative psych ROS   GI/Hepatic Neg liver ROS, GERD  Medicated and Controlled,  Endo/Other  negative endocrine ROS  Renal/GU negative Renal ROS  negative genitourinary   Musculoskeletal Left shoulder melanoma    Abdominal (+)  Abdomen: soft. Bowel sounds: normal.  Peds  Hematology negative hematology ROS (+)   Anesthesia Other Findings   Reproductive/Obstetrics                            Anesthesia Physical Anesthesia Plan  ASA: II  Anesthesia Plan: General   Post-op Pain Management:    Induction: Intravenous  PONV Risk Score and Plan: 2 and Ondansetron, Dexamethasone, Midazolam and Treatment may vary due to age or medical condition  Airway Management Planned: Mask and LMA  Additional Equipment: None  Intra-op Plan:   Post-operative Plan: Extubation in OR  Informed Consent: I have reviewed the patients History and Physical, chart, labs and discussed the procedure including the risks, benefits and alternatives for the proposed anesthesia with the patient or authorized representative who has indicated his/her understanding and acceptance.     Dental advisory given  Plan Discussed with: CRNA  Anesthesia Plan Comments: (Lab Results      Component                Value               Date                      NA                       137                 01/01/2021                 K                        4.4                 01/01/2021                CO2                      25                  01/01/2021                GLUCOSE                  95                  01/01/2021                BUN  9                   01/01/2021                CREATININE               0.92                01/01/2021                CALCIUM                  9.6                 01/01/2021                GFRNONAA                 >60                 01/01/2021                GFRAA                    126                 11/25/2020          )       Anesthesia Quick Evaluation

## 2021-01-03 NOTE — Transfer of Care (Signed)
Immediate Anesthesia Transfer of Care Note  Patient: Jordan Collins  Procedure(s) Performed: WIDE LOCAL EXCISION MELANOMA LEFT SHOULDER (Left Shoulder)  Patient Location: PACU  Anesthesia Type:General  Level of Consciousness: drowsy  Airway & Oxygen Therapy: Patient Spontanous Breathing and Patient connected to face mask oxygen  Post-op Assessment: Report given to RN and Post -op Vital signs reviewed and stable  Post vital signs: Reviewed and stable  Last Vitals:  Vitals Value Taken Time  BP 129/69 01/03/21 0952  Temp    Pulse 87 01/03/21 0954  Resp 18 01/03/21 0954  SpO2 96 % 01/03/21 0954  Vitals shown include unvalidated device data.  Last Pain:  Vitals:   01/03/21 0758  TempSrc: Oral  PainSc: 2          Complications: No complications documented.

## 2021-01-03 NOTE — Anesthesia Procedure Notes (Signed)
Procedure Name: LMA Insertion Performed by: Darl Kuss S, CRNA Pre-anesthesia Checklist: Patient identified, Emergency Drugs available, Suction available and Patient being monitored Patient Re-evaluated:Patient Re-evaluated prior to induction Oxygen Delivery Method: Circle System Utilized Preoxygenation: Pre-oxygenation with 100% oxygen Induction Type: IV induction Ventilation: Mask ventilation without difficulty LMA: LMA inserted LMA Size: 5.0 Number of attempts: 1 Airway Equipment and Method: Bite block Placement Confirmation: positive ETCO2 Tube secured with: Tape Dental Injury: Teeth and Oropharynx as per pre-operative assessment        

## 2021-01-03 NOTE — Op Note (Signed)
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 is a 45 yo male who presented with a pigmented lesion on his right shoulder. He underwent a biopsy by his dermatologist that showed a 0.82mm thick superficial spreading melanoma with ulceration. He was referred for wide local excision.  Findings: Lesion on left posterior shoulder excised with 1cm circumferential margins. Total excised dimensions of 9.5cm by 3cm.  Procedure details: Informed consent was obtained in the preoperative area prior to the procedure. General anesthesia was induced and the patient was placed in the right lateral decubitus position. Perioperative antibiotics were administered per SCIP guidelines. The left shoulder was prepped and draped in the usual sterile fashion. A pre-procedure timeout was taken verifying patient identity, surgical site and procedure to be performed.  The pigmented lesion on the left shoulder was approximately 0.5cm in diameter. Margins of 1cm from the edges of the lesion were measured and marked circumferentially. An elliptical skin incision was then made to include these margins, with the long axis of the incision running horizontally. The subcutaneous tissue was divided with cautery down to the muscle fascia. The skin and subcutaneous tissue were then taken off the muscle fascia with cautery. The specimen was oriented and sent for routine pathology. The wound was irrigated and hemostasis was achieved. The incision was infiltrated with 0.25% bupivicaine with epinephrine. The skin was reapproximated with a layer of 3-0 Vicryl deep dermal sutures. The  central aspect of the incision had some tension on it, and thus this part was reinforced with 3-0 Nylon horizontal mattress sutures. The skin was closed with a running subcuticular 4-0 monocryl suture. Dermabond was applied followed by a sterile dressing.   The patient tolerated the procedure well with no apparent complications. All counts were correct x2 at the end of the procedure. The patient was extubated and taken to PACU in stable condition.  Michaelle Birks, MD 01/03/21 10:04 AM

## 2021-01-03 NOTE — Anesthesia Postprocedure Evaluation (Signed)
Anesthesia Post Note  Patient: Jordan Collins  Procedure(s) Performed: WIDE LOCAL EXCISION MELANOMA LEFT SHOULDER (Left Shoulder)     Patient location during evaluation: PACU Anesthesia Type: General Level of consciousness: awake and alert Pain management: pain level controlled Vital Signs Assessment: post-procedure vital signs reviewed and stable Respiratory status: spontaneous breathing, nonlabored ventilation, respiratory function stable and patient connected to nasal cannula oxygen Cardiovascular status: blood pressure returned to baseline and stable Postop Assessment: no apparent nausea or vomiting Anesthetic complications: no   No complications documented.  Last Vitals:  Vitals:   01/03/21 1000 01/03/21 1020  BP: 124/62 124/79  Pulse: 84 76  Resp: 19 18  Temp:  36.7 C  SpO2: 99% 100%    Last Pain:  Vitals:   01/03/21 1020  TempSrc:   PainSc: 0-No pain                 Belenda Cruise P Ashantae Pangallo

## 2021-01-07 ENCOUNTER — Encounter (HOSPITAL_BASED_OUTPATIENT_CLINIC_OR_DEPARTMENT_OTHER): Payer: Self-pay | Admitting: Surgery

## 2021-01-07 LAB — SURGICAL PATHOLOGY

## 2021-03-18 DIAGNOSIS — G4733 Obstructive sleep apnea (adult) (pediatric): Secondary | ICD-10-CM | POA: Diagnosis not present

## 2021-04-28 DIAGNOSIS — F4323 Adjustment disorder with mixed anxiety and depressed mood: Secondary | ICD-10-CM | POA: Diagnosis not present

## 2021-04-28 DIAGNOSIS — Z630 Problems in relationship with spouse or partner: Secondary | ICD-10-CM | POA: Diagnosis not present

## 2021-05-12 DIAGNOSIS — Z630 Problems in relationship with spouse or partner: Secondary | ICD-10-CM | POA: Diagnosis not present

## 2021-05-12 DIAGNOSIS — F4323 Adjustment disorder with mixed anxiety and depressed mood: Secondary | ICD-10-CM | POA: Diagnosis not present

## 2021-06-16 DIAGNOSIS — F4323 Adjustment disorder with mixed anxiety and depressed mood: Secondary | ICD-10-CM | POA: Diagnosis not present

## 2021-06-16 DIAGNOSIS — G4733 Obstructive sleep apnea (adult) (pediatric): Secondary | ICD-10-CM | POA: Diagnosis not present

## 2021-06-16 DIAGNOSIS — Z630 Problems in relationship with spouse or partner: Secondary | ICD-10-CM | POA: Diagnosis not present

## 2021-06-18 DIAGNOSIS — G4733 Obstructive sleep apnea (adult) (pediatric): Secondary | ICD-10-CM | POA: Diagnosis not present

## 2021-09-15 DIAGNOSIS — G4733 Obstructive sleep apnea (adult) (pediatric): Secondary | ICD-10-CM | POA: Diagnosis not present

## 2021-10-15 DIAGNOSIS — G4733 Obstructive sleep apnea (adult) (pediatric): Secondary | ICD-10-CM | POA: Diagnosis not present

## 2021-10-27 DIAGNOSIS — F4323 Adjustment disorder with mixed anxiety and depressed mood: Secondary | ICD-10-CM | POA: Diagnosis not present

## 2021-10-27 DIAGNOSIS — Z630 Problems in relationship with spouse or partner: Secondary | ICD-10-CM | POA: Diagnosis not present

## 2021-11-15 DIAGNOSIS — G4733 Obstructive sleep apnea (adult) (pediatric): Secondary | ICD-10-CM | POA: Diagnosis not present

## 2021-11-26 ENCOUNTER — Ambulatory Visit (INDEPENDENT_AMBULATORY_CARE_PROVIDER_SITE_OTHER): Payer: BC Managed Care – PPO | Admitting: Family Medicine

## 2021-11-26 ENCOUNTER — Other Ambulatory Visit: Payer: Self-pay

## 2021-11-26 ENCOUNTER — Encounter: Payer: Self-pay | Admitting: Family Medicine

## 2021-11-26 VITALS — BP 128/84 | HR 80 | Ht 68.0 in | Wt 244.8 lb

## 2021-11-26 DIAGNOSIS — K7460 Unspecified cirrhosis of liver: Secondary | ICD-10-CM

## 2021-11-26 DIAGNOSIS — Z0000 Encounter for general adult medical examination without abnormal findings: Secondary | ICD-10-CM | POA: Diagnosis not present

## 2021-11-26 DIAGNOSIS — Z1211 Encounter for screening for malignant neoplasm of colon: Secondary | ICD-10-CM

## 2021-11-26 DIAGNOSIS — E781 Pure hyperglyceridemia: Secondary | ICD-10-CM

## 2021-11-26 DIAGNOSIS — G4733 Obstructive sleep apnea (adult) (pediatric): Secondary | ICD-10-CM

## 2021-11-26 DIAGNOSIS — Z23 Encounter for immunization: Secondary | ICD-10-CM

## 2021-11-26 NOTE — Patient Instructions (Signed)
You look good. I'd love for you to lose weight. Make an appointment of the way out for a lab visit on Monday.  Remember to fast for the blood work. Someone should call with the GI referral for the colonoscopy. I will call probably Tuesday with the lab test results.

## 2021-11-27 ENCOUNTER — Encounter: Payer: Self-pay | Admitting: Family Medicine

## 2021-11-27 NOTE — Assessment & Plan Note (Signed)
GI referral to begin colon cancer screening at age 46

## 2021-11-27 NOTE — Assessment & Plan Note (Signed)
Encourage continued work on wt loss.

## 2021-11-27 NOTE — Assessment & Plan Note (Signed)
From fatty liver.  Recheck labs.

## 2021-11-27 NOTE — Progress Notes (Signed)
° ° °  SUBJECTIVE:   CHIEF COMPLAINT / HPI:   Annual exam: Feels good.  Eating healthy.  Exercising 5 days per week.  Unfortunately, has not been able to lose weight.  Issues: Melanoma resection.  No symptoms.  He is considered to be a surgical cure. Borderline hypertension.  Checks BP at home periodically.  Results run normal FHx of DM Hx of fatty liver with cirrhosis.  As above, no significant wt loss.   Will turn 38 in July.  He is aware that 61 is the age for first screening colonoscopy. Needs flu vaccine.  Declines covid Previous high triglycerides.  Labs need to be done fasting.  Not fasting today.  PERTINENT  PMH / PSH:  Denies CP, DOE, bleeding, weakness or focal pain.  Denies change in bowel bladder or appetite.  OBJECTIVE:   BP 128/84 (BP Location: Left Arm, Cuff Size: Large)    Pulse 80    Ht 5\' 8"  (1.727 m)    Wt 244 lb 12.8 oz (111 kg)    SpO2 99%    BMI 37.22 kg/m   HEENT normal Neck supple Lungs clear Cardiac RRR without m or g Abd benign Ext no edema.  Scar from melanoma removal no pigment in the margins.  Normal appearing divot. Neuro WNL  ASSESSMENT/PLAN:   No problem-specific Assessment & Plan notes found for this encounter.     Zenia Resides, MD [place]

## 2021-11-27 NOTE — Assessment & Plan Note (Signed)
Doing well on CPAP.  Wt loss would help.

## 2021-11-27 NOTE — Assessment & Plan Note (Signed)
Normal male with no at risk behaviors.  Obesity is his biggest modifiable risk

## 2021-11-27 NOTE — Assessment & Plan Note (Signed)
Check fasting labs 

## 2021-12-01 ENCOUNTER — Other Ambulatory Visit: Payer: Self-pay

## 2021-12-01 ENCOUNTER — Other Ambulatory Visit (INDEPENDENT_AMBULATORY_CARE_PROVIDER_SITE_OTHER): Payer: BC Managed Care – PPO

## 2021-12-01 DIAGNOSIS — K7460 Unspecified cirrhosis of liver: Secondary | ICD-10-CM | POA: Diagnosis not present

## 2021-12-01 DIAGNOSIS — E781 Pure hyperglyceridemia: Secondary | ICD-10-CM | POA: Diagnosis not present

## 2021-12-01 LAB — POCT GLYCOSYLATED HEMOGLOBIN (HGB A1C): Hemoglobin A1C: 5.5 % (ref 4.0–5.6)

## 2021-12-02 LAB — CMP14+EGFR
ALT: 38 IU/L (ref 0–44)
AST: 26 IU/L (ref 0–40)
Albumin/Globulin Ratio: 1.7 (ref 1.2–2.2)
Albumin: 4.4 g/dL (ref 4.0–5.0)
Alkaline Phosphatase: 80 IU/L (ref 44–121)
BUN/Creatinine Ratio: 14 (ref 9–20)
BUN: 12 mg/dL (ref 6–24)
Bilirubin Total: 0.4 mg/dL (ref 0.0–1.2)
CO2: 23 mmol/L (ref 20–29)
Calcium: 9.3 mg/dL (ref 8.7–10.2)
Chloride: 103 mmol/L (ref 96–106)
Creatinine, Ser: 0.87 mg/dL (ref 0.76–1.27)
Globulin, Total: 2.6 g/dL (ref 1.5–4.5)
Glucose: 105 mg/dL — ABNORMAL HIGH (ref 70–99)
Potassium: 4.7 mmol/L (ref 3.5–5.2)
Sodium: 142 mmol/L (ref 134–144)
Total Protein: 7 g/dL (ref 6.0–8.5)
eGFR: 109 mL/min/{1.73_m2} (ref >59)

## 2021-12-02 LAB — LIPID PANEL
Chol/HDL Ratio: 5.1 ratio — ABNORMAL HIGH (ref 0.0–5.0)
Cholesterol, Total: 133 mg/dL (ref 100–199)
HDL: 26 mg/dL — ABNORMAL LOW (ref >39)
LDL Chol Calc (NIH): 72 mg/dL (ref 0–99)
Triglycerides: 212 mg/dL — ABNORMAL HIGH (ref 0–149)
VLDL Cholesterol Cal: 35 mg/dL (ref 5–40)

## 2021-12-14 DIAGNOSIS — G4733 Obstructive sleep apnea (adult) (pediatric): Secondary | ICD-10-CM | POA: Diagnosis not present

## 2022-01-11 DIAGNOSIS — G4733 Obstructive sleep apnea (adult) (pediatric): Secondary | ICD-10-CM | POA: Diagnosis not present

## 2022-02-04 ENCOUNTER — Other Ambulatory Visit: Payer: Self-pay | Admitting: Family Medicine

## 2022-02-04 DIAGNOSIS — K2100 Gastro-esophageal reflux disease with esophagitis, without bleeding: Secondary | ICD-10-CM

## 2022-02-11 DIAGNOSIS — G4733 Obstructive sleep apnea (adult) (pediatric): Secondary | ICD-10-CM | POA: Diagnosis not present

## 2022-03-14 DIAGNOSIS — G4733 Obstructive sleep apnea (adult) (pediatric): Secondary | ICD-10-CM | POA: Diagnosis not present

## 2022-03-24 ENCOUNTER — Encounter: Payer: Self-pay | Admitting: *Deleted

## 2022-04-14 DIAGNOSIS — G4733 Obstructive sleep apnea (adult) (pediatric): Secondary | ICD-10-CM | POA: Diagnosis not present

## 2022-05-09 ENCOUNTER — Other Ambulatory Visit: Payer: Self-pay | Admitting: Family Medicine

## 2022-05-09 DIAGNOSIS — K2100 Gastro-esophageal reflux disease with esophagitis, without bleeding: Secondary | ICD-10-CM

## 2022-05-14 DIAGNOSIS — G4733 Obstructive sleep apnea (adult) (pediatric): Secondary | ICD-10-CM | POA: Diagnosis not present

## 2022-06-12 DIAGNOSIS — G4733 Obstructive sleep apnea (adult) (pediatric): Secondary | ICD-10-CM | POA: Diagnosis not present

## 2022-06-14 DIAGNOSIS — G4733 Obstructive sleep apnea (adult) (pediatric): Secondary | ICD-10-CM | POA: Diagnosis not present

## 2022-07-13 DIAGNOSIS — G4733 Obstructive sleep apnea (adult) (pediatric): Secondary | ICD-10-CM | POA: Diagnosis not present

## 2022-08-12 DIAGNOSIS — G4733 Obstructive sleep apnea (adult) (pediatric): Secondary | ICD-10-CM | POA: Diagnosis not present

## 2022-08-26 DIAGNOSIS — Z23 Encounter for immunization: Secondary | ICD-10-CM | POA: Diagnosis not present

## 2022-09-12 DIAGNOSIS — G4733 Obstructive sleep apnea (adult) (pediatric): Secondary | ICD-10-CM | POA: Diagnosis not present

## 2022-09-14 DIAGNOSIS — G4733 Obstructive sleep apnea (adult) (pediatric): Secondary | ICD-10-CM | POA: Diagnosis not present

## 2022-10-12 DIAGNOSIS — G4733 Obstructive sleep apnea (adult) (pediatric): Secondary | ICD-10-CM | POA: Diagnosis not present

## 2022-11-24 ENCOUNTER — Encounter: Payer: Self-pay | Admitting: Family Medicine

## 2024-01-31 ENCOUNTER — Encounter: Payer: Self-pay | Admitting: *Deleted

## 2024-01-31 ENCOUNTER — Other Ambulatory Visit: Payer: Self-pay

## 2024-01-31 ENCOUNTER — Ambulatory Visit
Admission: EM | Admit: 2024-01-31 | Discharge: 2024-01-31 | Disposition: A | Discharge status: Home / Self Care | Attending: Family Medicine | Admitting: Family Medicine

## 2024-01-31 DIAGNOSIS — B370 Candidal stomatitis: Secondary | ICD-10-CM

## 2024-01-31 DIAGNOSIS — S00522A Blister (nonthermal) of oral cavity, initial encounter: Secondary | ICD-10-CM | POA: Diagnosis not present

## 2024-01-31 MED ORDER — FLUCONAZOLE 150 MG PO TABS
150.0000 mg | ORAL_TABLET | Freq: Every day | ORAL | 0 refills | Status: AC
Start: 2024-01-31 — End: 2024-02-03

## 2024-01-31 MED ORDER — PREDNISONE 20 MG PO TABS: 20.0000 mg | ORAL_TABLET | Freq: Two times a day (BID) | ORAL | 0 refills | Status: AC

## 2024-01-31 MED ORDER — LIDOCAINE VISCOUS HCL 2 % MT SOLN: 15.0000 mL | OROMUCOSAL | 0 refills | Status: AC | PRN

## 2024-01-31 MED ORDER — NYSTATIN 100000 UNIT/ML MT SUSP: 500000.0000 [IU] | Freq: Four times a day (QID) | OROMUCOSAL | 0 refills | Status: AC | PRN

## 2024-01-31 NOTE — Discharge Instructions (Signed)
 Mix nystatin and lidocaine viscous together gargle swish and spit up to 4 times daily.  Aching viscous can be used every 4 hours as needed.  Tonight start Diflucan and do at least 1-2 oral rinses to help reduce pain.  Prednisone 20 mg twice daily beginning tomorrow and take for total of 5 days to treat blisters and inflammation in the mouth. Follow-up with primary care doctor within 1 to 2 weeks to ensure that all symptoms have completely resolved.

## 2024-01-31 NOTE — ED Triage Notes (Signed)
 Pt states at at [facility] of Thursday evening. Since then he has been having pain in his mouth and tongue. Painful to eat

## 2024-01-31 NOTE — ED Provider Notes (Signed)
 [facility]    CSN: [id number] Arrival date & time: 01/31/24  1652      History   Chief Complaint Chief Complaint  Patient presents with   Oral Pain    HPI Jordan Collins is a 48 y.o. male.    Oral Pain  Oral blisters and white filmy substance in mouth x 5 days.  Patient endorses that his mouth is severely painful.  Patient reports the only new thing that he has done recently was ate pork chop steak house.  Reports the meat was very spicy and pain developed daily following that meal.  He has had difficulty eating due to the pain in his mouth.  Has no history of thrush.  Currently not taking any medications that would press his immune system.  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   Carpal tunnel syndrome, left 08/15/2014   Hepatic cirrhosis (HCC) 01/02/2014   Erectile dysfunction 12/29/2013   Morbid obesity (HCC) 04/27/2013   Quit smoking 09/17/2011   OSA (obstructive sleep apnea) 04/16/2011    Class: Chronic   LOW BACK PAIN 09/26/2010   HYPERTRIGLYCERIDEMIA 03/31/2010   GERD 02/26/2010    Past Surgical History:  Procedure Laterality Date   IMPACTED THIRD MOLAR REMOVAL     MELANOMA EXCISION Left 01/03/2021   Procedure: WIDE LOCAL EXCISION MELANOMA LEFT SHOULDER;  Surgeon: Lujean Sake, MD;  Location: [facility];  Service: General;  Laterality: Left;       Home Medications    Prior to Admission medications   Medication Sig Start Date End Date Taking?  Authorizing Provider  aspirin 81 MG tablet Take 81 mg by mouth daily.   Yes [provider]  fluconazole (DIFLUCAN) 150 MG tablet Take 1 tablet (150 mg total) by mouth daily for 3 doses. Repeat if needed 01/31/24 02/03/24 Yes Buena Carmine, NP  lidocaine (XYLOCAINE) 2 % solution Use as directed 15 mLs in the mouth or throat every 4 (four) hours as needed for mouth pain. 01/31/24  Yes Buena Carmine, NP  Multiple Vitamins-Minerals (MULTIVITAMIN WITH MINERALS) tablet Take 1 tablet by mouth daily.   Yes [provider]  nystatin (MYCOSTATIN) 100000 UNIT/ML suspension Use as directed 5 mLs (500,000 Units total) in the mouth or throat 4 (four) times daily as needed (oral sores). 01/31/24  Yes Buena Carmine, NP  omeprazole (PRILOSEC) 40 MG capsule TAKE 1 CAPSULE (40 MG TOTAL) BY MOUTH DAILY. 05/11/22  Yes Hensel, Azucena Bollard, MD  predniSONE (DELTASONE) 20 MG tablet Take 1 tablet (20 mg total) by mouth 2 (two) times daily with a meal for 5 days. 01/31/24 02/05/24 Yes Buena Carmine, NP  tadalafil (CIALIS) 10 MG tablet TAKE 1 TABLET BY MOUTH ONCE DAILY AS NEEDED FOR ERECTILE DYSFUNCTION Patient taking differently: Take 10 mg by mouth daily as needed for erectile dysfunction. TAKE 1 TABLET BY MOUTH ONCE DAILY AS NEEDED FOR ERECTILE DYSFUNCTION 11/25/20   Anibal Kent,  MD    Family History Family History  Problem Relation Age of Onset   Heart disease Paternal Grandfather    Stroke Paternal Grandfather    Liver cancer Father    Diabetes Father    Hypertension Father    Hyperlipidemia Father    Heart disease Other        heart attack    Social History Social History   Tobacco Use   Smoking status: Former    Current packs/day: 0.00    Average packs/day: 0.5 packs/day for 10.0 years (5.0 ttl pk-yrs)    Types: Cigarettes    Start date: 2005    Quit date: 2015    Years since quitting: 10.2   Smokeless tobacco: Never  Vaping Use   Vaping status: Never Used   Substance Use Topics   Alcohol use: Not Currently    Alcohol/week: 3.0 standard drinks of alcohol    Types: 3 Cans of beer per week    Comment: once a month   Drug use: No     Allergies   Patient has no known allergies.   Review of Systems Review of Systems Pertinent negatives listed in HPI   Physical Exam Triage Vital Signs ED Triage Vitals  Encounter Vitals Group     BP 01/31/24 1727 128/83     Systolic BP Percentile --      Diastolic BP Percentile --      Pulse Rate 01/31/24 1727 85     Resp 01/31/24 1727 18     Temp 01/31/24 1727 99 F (37.2 C)     Temp Source 01/31/24 1727 Oral     SpO2 01/31/24 1727 97 %     Weight --      Height --      Head Circumference --      Peak Flow --      Pain Score 01/31/24 1725 10     Pain Loc --      Pain Education --      Exclude from Growth Chart --    No data found.  Updated Vital Signs BP 128/83 (BP Location: Left Arm)   Pulse 85   Temp 99 F (37.2 C) (Oral)   Resp 18   SpO2 97%   Visual Acuity Right Eye Distance:   Left Eye Distance:   Bilateral Distance:    Right Eye Near:   Left Eye Near:    Bilateral Near:     Physical Exam Vitals reviewed.  Constitutional:      Appearance: Normal appearance.  HENT:     Head: Normocephalic and atraumatic.     Mouth/Throat:     Dentition: Dental tenderness present.     Tongue: Lesions present.     Palate: Lesions present.     Comments: Thrush like discharge home tongue, palate, and gums Eyes:     Extraocular Movements: Extraocular movements intact.  Cardiovascular:     Rate and Rhythm: Normal rate and regular rhythm.  Pulmonary:     Effort: Pulmonary effort is normal.     Breath sounds: Normal breath sounds.  Skin:    General: Skin is warm and dry.  Neurological:     General: No focal deficit present.     Mental Status: He is alert.      UC Treatments / Results  Labs (all labs ordered are listed, but only abnormal results are displayed) Labs Reviewed -  No data to display  EKG   Radiology No results  found.  Procedures Procedures (including critical care time)  Medications Ordered in UC Medications - No data to display  Initial Impression / Assessment and Plan / UC Course  I have reviewed the triage vital signs and the nursing notes.  Pertinent labs & imaging results that were available during my care of the patient were reviewed by me and considered in my medical decision making (see chart for details).    Evaluation of oropharyngeal significant for significant oral thrush and patient has multiple blisterlike lesions on his tongue and cheeks and upper mouth. Treating with Magic mouthwash-nystatin and lidocaine viscous, prednisone 20 mg twice daily for 5 days to reduce inflammation and now pain and swelling//blistery lesions.  Diflucan 150 mg daily for 3 days.  Strict return precautions if symptoms have not resolved following completion of treatment. Final Clinical Impressions(s) / UC Diagnoses   Final diagnoses:  Oral thrush  Blister (nonthermal) of oral cavity, initial encounter     Discharge Instructions      Mix nystatin and lidocaine viscous together gargle swish and spit up to 4 times daily.  Aching viscous can be used every 4 hours as needed.  Tonight start Diflucan and do at least 1-2 oral rinses to help reduce pain.  Prednisone 20 mg twice daily beginning tomorrow and take for total of 5 days to treat blisters and inflammation in the mouth. Follow-up with primary care doctor within 1 to 2 weeks to ensure that all symptoms have completely resolved.    ED Prescriptions     Medication Sig Dispense Auth. Provider   nystatin (MYCOSTATIN) 100000 UNIT/ML suspension Use as directed 5 mLs (500,000 Units total) in the mouth or throat 4 (four) times daily as needed (oral sores). 120 mL Buena Carmine, NP   lidocaine (XYLOCAINE) 2 % solution Use as directed 15 mLs in the mouth or throat every 4 (four) hours as needed for  mouth pain. 100 mL Buena Carmine, NP   fluconazole (DIFLUCAN) 150 MG tablet Take 1 tablet (150 mg total) by mouth daily for 3 doses. Repeat if needed 3 tablet Buena Carmine, NP   predniSONE (DELTASONE) 20 MG tablet Take 1 tablet (20 mg total) by mouth 2 (two) times daily with a meal for 5 days. 10 tablet Buena Carmine, NP      PDMP not reviewed this encounter.   Buena Carmine, NP 02/01/24 1755

## 2024-11-20 ENCOUNTER — Encounter: Admitting: Family Medicine
